# Patient Record
Sex: Female | Born: 1950 | Race: Black or African American | Hispanic: No | Marital: Married | Smoking: Never smoker
Health system: Southern US, Community
[De-identification: ages and names within clinical notes are randomized; demographics above are authoritative.]

## PROBLEM LIST (undated history)

## (undated) DIAGNOSIS — C801 Malignant (primary) neoplasm, unspecified: Secondary | ICD-10-CM

## (undated) HISTORY — PX: MM BREAST STEREO BIOPSY LEFT (ARMC HX): HXRAD1824

## (undated) HISTORY — PX: OTHER SURGICAL HISTORY: SHX169

## (undated) HISTORY — PX: COLONOSCOPY: SHX174

---

## 2001-08-20 ENCOUNTER — Other Ambulatory Visit: Admission: RE | Admit: 2001-08-20 | Discharge: 2001-08-20 | Payer: Self-pay | Admitting: Obstetrics and Gynecology

## 2001-10-29 ENCOUNTER — Ambulatory Visit (HOSPITAL_COMMUNITY): Admission: RE | Admit: 2001-10-29 | Discharge: 2001-10-29 | Payer: Self-pay | Admitting: General Surgery

## 2003-04-13 ENCOUNTER — Ambulatory Visit (HOSPITAL_COMMUNITY): Admission: RE | Admit: 2003-04-13 | Discharge: 2003-04-13 | Payer: Self-pay | Admitting: Physician Assistant

## 2003-04-13 ENCOUNTER — Encounter: Payer: Self-pay | Admitting: Pediatrics

## 2003-04-17 ENCOUNTER — Encounter (HOSPITAL_COMMUNITY): Admission: RE | Admit: 2003-04-17 | Discharge: 2003-05-17 | Payer: Self-pay | Admitting: Pediatrics

## 2005-06-28 ENCOUNTER — Ambulatory Visit (HOSPITAL_COMMUNITY): Admission: RE | Admit: 2005-06-28 | Discharge: 2005-06-28 | Payer: Self-pay | Admitting: Internal Medicine

## 2006-08-14 ENCOUNTER — Ambulatory Visit (HOSPITAL_COMMUNITY): Admission: RE | Admit: 2006-08-14 | Discharge: 2006-08-14 | Payer: Self-pay | Admitting: Obstetrics and Gynecology

## 2009-09-14 ENCOUNTER — Ambulatory Visit (HOSPITAL_COMMUNITY): Admission: RE | Admit: 2009-09-14 | Discharge: 2009-09-14 | Payer: Self-pay | Admitting: Pediatrics

## 2012-10-02 HISTORY — PX: MM BREAST STEREO BIOPSY LEFT (ARMC HX): HXRAD1824

## 2012-10-16 ENCOUNTER — Other Ambulatory Visit: Payer: Self-pay | Admitting: Adult Health

## 2012-10-16 DIAGNOSIS — Z139 Encounter for screening, unspecified: Secondary | ICD-10-CM

## 2012-10-18 ENCOUNTER — Ambulatory Visit (HOSPITAL_COMMUNITY)
Admission: RE | Admit: 2012-10-18 | Discharge: 2012-10-18 | Disposition: A | Discharge status: Home / Self Care | Payer: BC Managed Care – PPO | Source: Ambulatory Visit | Attending: Adult Health | Admitting: Adult Health

## 2012-10-18 DIAGNOSIS — Z1231 Encounter for screening mammogram for malignant neoplasm of breast: Secondary | ICD-10-CM | POA: Insufficient documentation

## 2012-10-18 DIAGNOSIS — Z139 Encounter for screening, unspecified: Secondary | ICD-10-CM

## 2012-10-23 ENCOUNTER — Other Ambulatory Visit: Payer: Self-pay | Admitting: Adult Health

## 2012-10-23 DIAGNOSIS — R928 Other abnormal and inconclusive findings on diagnostic imaging of breast: Secondary | ICD-10-CM

## 2012-11-13 ENCOUNTER — Ambulatory Visit (HOSPITAL_COMMUNITY)
Admission: RE | Admit: 2012-11-13 | Discharge: 2012-11-13 | Disposition: A | Discharge status: Home / Self Care | Payer: BC Managed Care – PPO | Source: Ambulatory Visit | Attending: Adult Health | Admitting: Adult Health

## 2012-11-13 ENCOUNTER — Other Ambulatory Visit: Payer: Self-pay | Admitting: Adult Health

## 2012-11-13 DIAGNOSIS — R921 Mammographic calcification found on diagnostic imaging of breast: Secondary | ICD-10-CM

## 2012-11-13 DIAGNOSIS — R928 Other abnormal and inconclusive findings on diagnostic imaging of breast: Secondary | ICD-10-CM | POA: Insufficient documentation

## 2012-11-25 ENCOUNTER — Other Ambulatory Visit (HOSPITAL_COMMUNITY): Payer: Self-pay | Admitting: Radiology

## 2012-11-25 ENCOUNTER — Ambulatory Visit
Admission: RE | Admit: 2012-11-25 | Discharge: 2012-11-25 | Disposition: A | Discharge status: Home / Self Care | Payer: BC Managed Care – PPO | Source: Ambulatory Visit | Attending: Adult Health | Admitting: Adult Health

## 2012-11-25 ENCOUNTER — Other Ambulatory Visit: Payer: Self-pay | Admitting: Adult Health

## 2012-11-25 DIAGNOSIS — R921 Mammographic calcification found on diagnostic imaging of breast: Secondary | ICD-10-CM

## 2012-12-12 ENCOUNTER — Other Ambulatory Visit (HOSPITAL_COMMUNITY)
Admission: RE | Admit: 2012-12-12 | Discharge: 2012-12-12 | Disposition: A | Discharge status: Home / Self Care | Payer: BC Managed Care – PPO | Source: Ambulatory Visit | Attending: Obstetrics and Gynecology | Admitting: Obstetrics and Gynecology

## 2012-12-12 ENCOUNTER — Other Ambulatory Visit: Payer: Self-pay | Admitting: Adult Health

## 2012-12-12 DIAGNOSIS — Z1151 Encounter for screening for human papillomavirus (HPV): Secondary | ICD-10-CM | POA: Insufficient documentation

## 2012-12-12 DIAGNOSIS — Z01419 Encounter for gynecological examination (general) (routine) without abnormal findings: Secondary | ICD-10-CM | POA: Insufficient documentation

## 2012-12-19 ENCOUNTER — Telehealth: Payer: Self-pay | Admitting: Adult Health

## 2012-12-20 NOTE — Telephone Encounter (Signed)
Confirmed WNL result of CBC, Lipid profile, CMP, and TSH.

## 2013-05-27 IMAGING — MG MM BX 1ST LESION STEREO
3 series · 3 of 3 positions shown · non-contrast
Comparison: Previous exams.

***ADDENDUM*** CREATED: 11/26/2012 [DATE]

Pathology revealed hyalinized and calcified fibroadenomatoid
nodules in the left breast. This was found to be concordant by Dr.
Hjhsiti E-Store. Pathology was relayed to the patient by telephone.
The patient reported doing well after the biopsy. Post biopsy
instructions were reviewed and her questions were answered. She was
encouraged to call The [REDACTED] for any
additional concerns. She was asked to return in 1 year for
screening mammography.
Pathology results are dictated by Arilenis Polonia RN, BSN on [facility]
***END ADDENDUM*** SIGNED BY: Marybel Healy, M.D.
CLINICAL DATA: Left breast calcifications.
STEREOTACTIC-GUIDED VACUUM ASSISTED BIOPSY OF THE LEFT BREAST AND
SPECIMEN RADIOGRAPH

[L CC (1 of 2)]
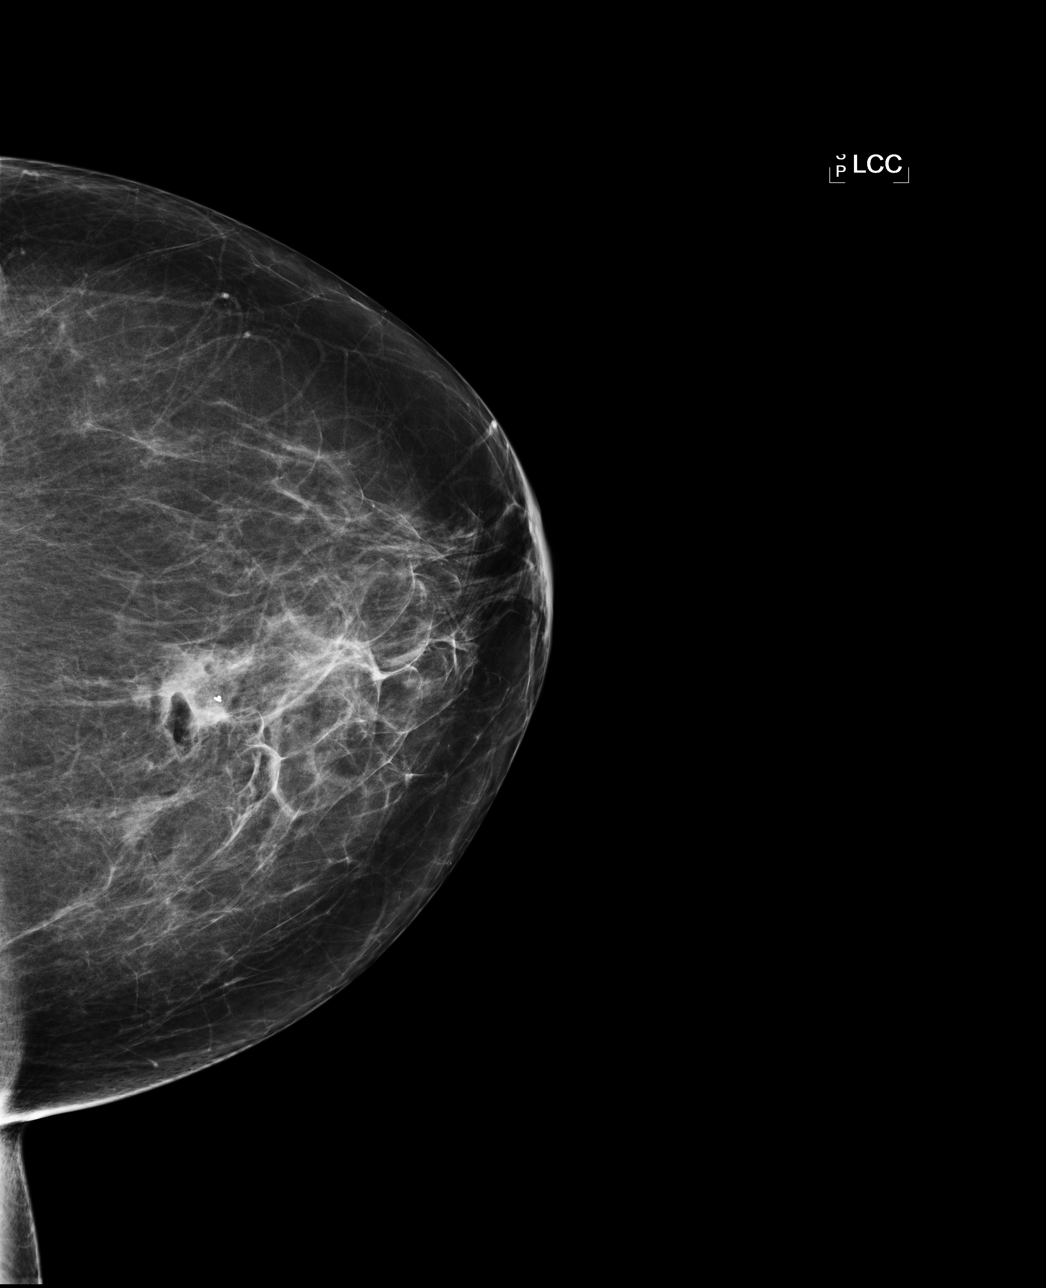

[L CC (2 of 2)]
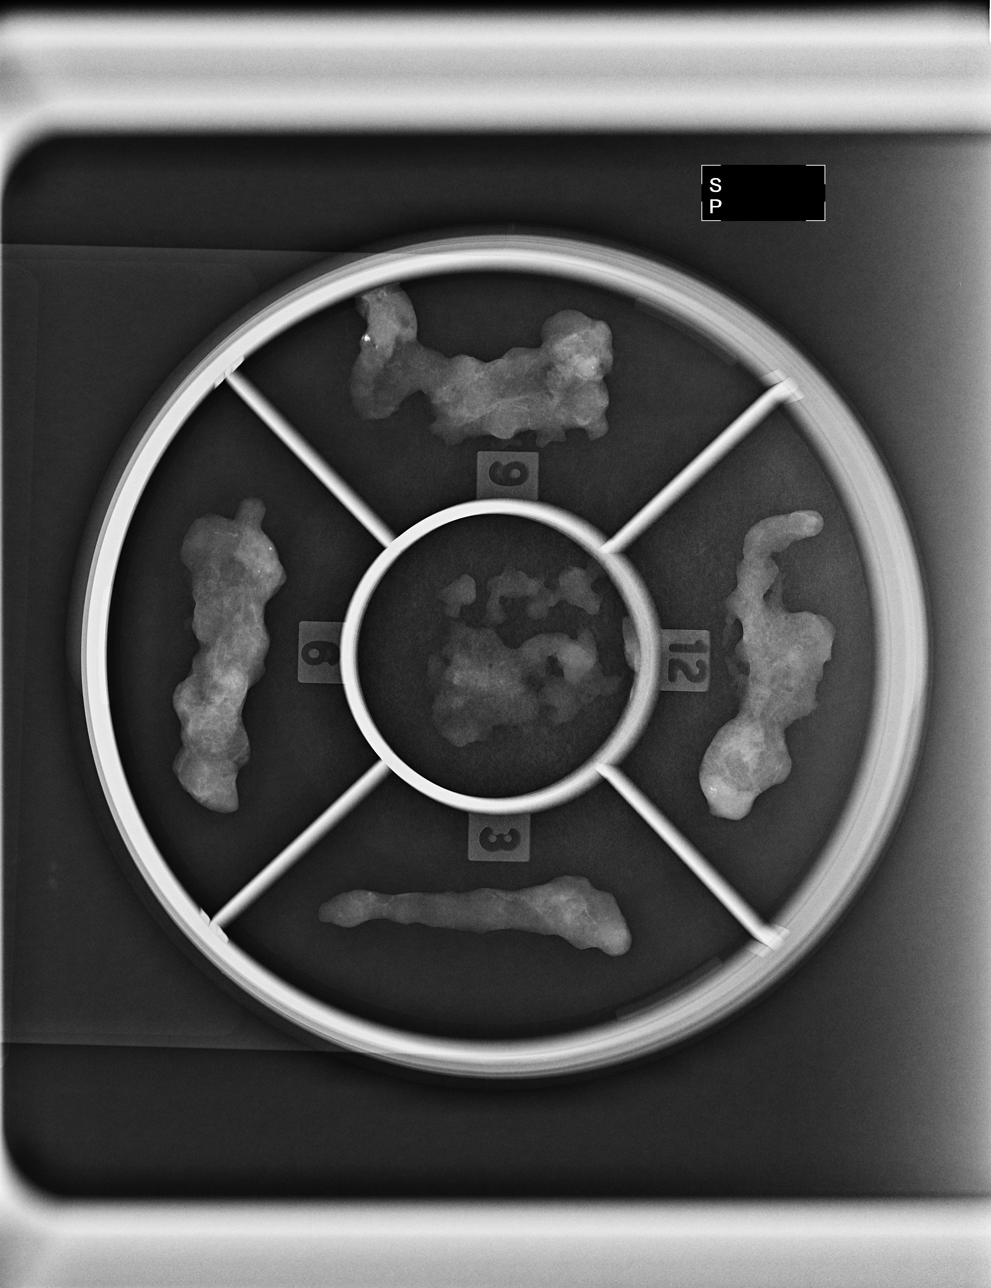

[L ML]
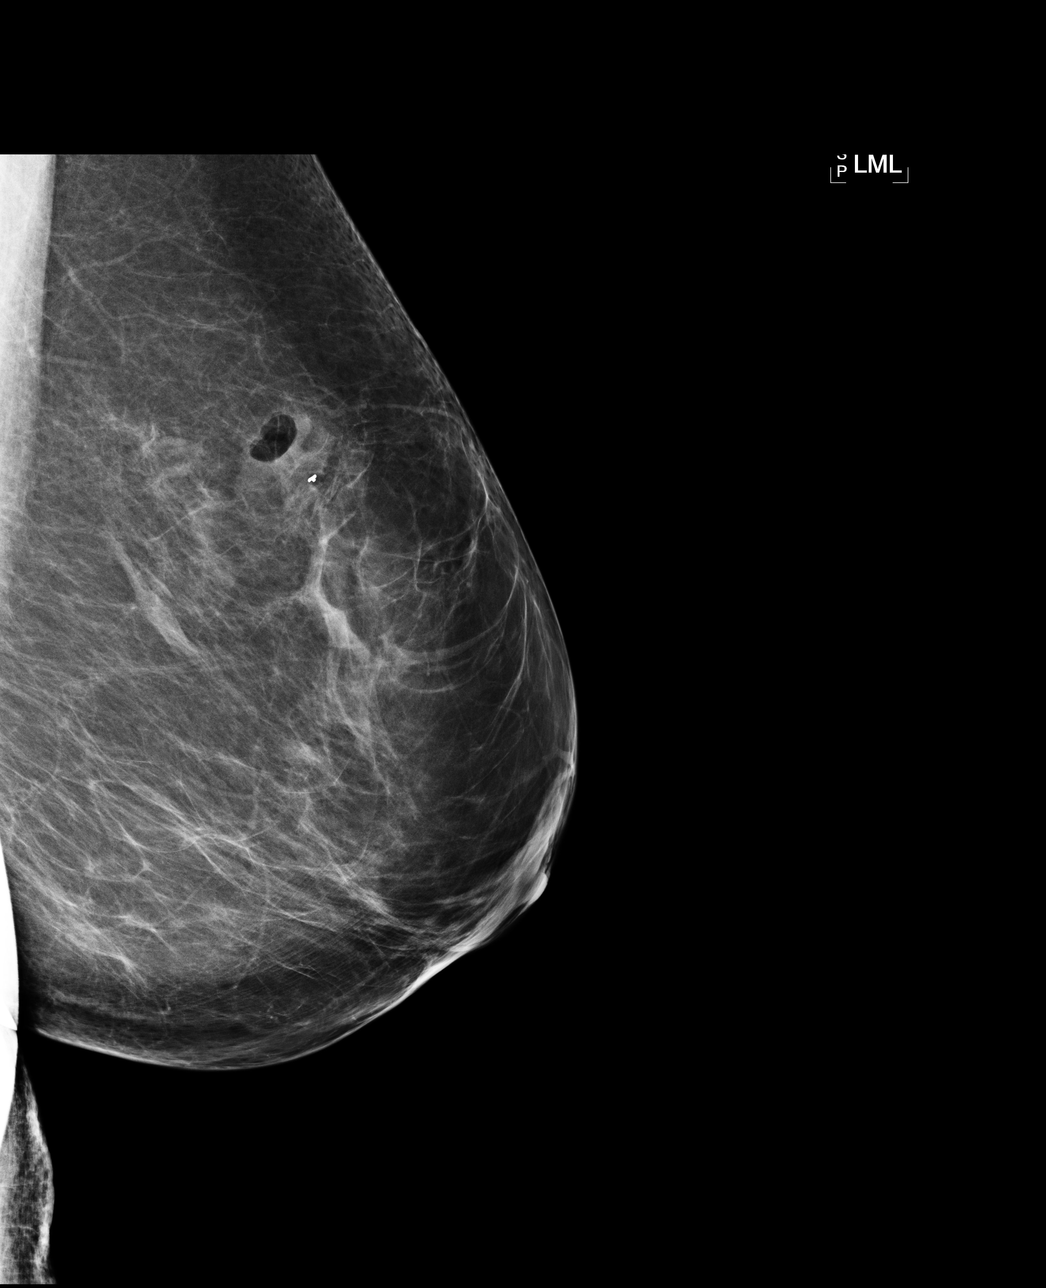

[3 of 3 positions shown; findings below may reference images not displayed]

I met with the patient and we discussed the procedure of
stereotactic-guided biopsy, including benefits and alternatives.
We discussed the high likelihood of a successful procedure. We
discussed the risks of the procedure, including infection,
bleeding, tissue injury, clip migration, and inadequate sampling.
Informed, written consent was given.

Using sterile technique, 2% lidocaine, stereotactic guidance, and a
9 gauge vacuum assisted device, biopsy was performed of the
calcifications located slightly medially and superiorly within the
left breast using a superior craniocaudal approach.  Specimen
radiograph was performed, showing multiple representative
calcifications within the specimen.  Specimens with calcifications
are identified for pathology.

At the conclusion of the procedure, a T-shaped tissue marker clip
was deployed into the biopsy cavity.  Follow-up 2-view mammogram
confirmed clip to be in appropriate position.
IMPRESSION: Stereotactic-guided biopsy of left breast calcifications as
discussed above.  No apparent complications.

## 2013-11-14 ENCOUNTER — Other Ambulatory Visit: Payer: Self-pay | Admitting: Adult Health

## 2013-11-14 DIAGNOSIS — Z1231 Encounter for screening mammogram for malignant neoplasm of breast: Secondary | ICD-10-CM

## 2013-11-28 ENCOUNTER — Ambulatory Visit (HOSPITAL_COMMUNITY): Payer: BC Managed Care – PPO

## 2014-01-05 ENCOUNTER — Other Ambulatory Visit (HOSPITAL_COMMUNITY): Payer: Self-pay | Admitting: Family Medicine

## 2014-01-05 DIAGNOSIS — C6930 Malignant neoplasm of unspecified choroid: Secondary | ICD-10-CM

## 2014-01-08 ENCOUNTER — Ambulatory Visit (HOSPITAL_COMMUNITY): Payer: BC Managed Care – PPO

## 2014-01-08 ENCOUNTER — Ambulatory Visit (HOSPITAL_COMMUNITY)
Admission: RE | Admit: 2014-01-08 | Discharge: 2014-01-08 | Disposition: A | Discharge status: Home / Self Care | Payer: BC Managed Care – PPO | Source: Ambulatory Visit | Attending: Family Medicine | Admitting: Family Medicine

## 2014-01-08 ENCOUNTER — Ambulatory Visit (HOSPITAL_COMMUNITY)
Admission: RE | Admit: 2014-01-08 | Discharge: 2014-01-08 | Disposition: A | Discharge status: Home / Self Care | Payer: BC Managed Care – PPO | Source: Ambulatory Visit | Attending: Adult Health | Admitting: Adult Health

## 2014-01-08 DIAGNOSIS — C6930 Malignant neoplasm of unspecified choroid: Secondary | ICD-10-CM | POA: Insufficient documentation

## 2014-01-08 DIAGNOSIS — Z1231 Encounter for screening mammogram for malignant neoplasm of breast: Secondary | ICD-10-CM | POA: Insufficient documentation

## 2014-01-08 MED ORDER — IOHEXOL 300 MG/ML  SOLN
100.0000 mL | Freq: Once | INTRAMUSCULAR | Status: AC | PRN
  Administered 2014-01-08: 100 mL via INTRAVENOUS

## 2014-01-08 MED ORDER — SODIUM CHLORIDE 0.9 % IJ SOLN
INTRAMUSCULAR | Status: AC
  Filled 2014-01-08: qty 500

## 2014-11-24 DIAGNOSIS — Z0289 Encounter for other administrative examinations: Secondary | ICD-10-CM

## 2014-12-02 ENCOUNTER — Other Ambulatory Visit: Payer: Self-pay | Admitting: Family Medicine

## 2014-12-02 DIAGNOSIS — R51 Headache: Principal | ICD-10-CM

## 2014-12-02 DIAGNOSIS — C6931 Malignant neoplasm of right choroid: Secondary | ICD-10-CM

## 2014-12-02 DIAGNOSIS — G8929 Other chronic pain: Secondary | ICD-10-CM

## 2014-12-15 ENCOUNTER — Ambulatory Visit
Admission: RE | Admit: 2014-12-15 | Discharge: 2014-12-15 | Disposition: A | Discharge status: Home / Self Care | Payer: BLUE CROSS/BLUE SHIELD | Source: Ambulatory Visit | Attending: Family Medicine | Admitting: Family Medicine

## 2014-12-15 DIAGNOSIS — C6931 Malignant neoplasm of right choroid: Secondary | ICD-10-CM

## 2014-12-15 DIAGNOSIS — R51 Headache: Principal | ICD-10-CM

## 2014-12-15 DIAGNOSIS — G8929 Other chronic pain: Secondary | ICD-10-CM

## 2014-12-15 MED ORDER — GADOBENATE DIMEGLUMINE 529 MG/ML IV SOLN
15.0000 mL | Freq: Once | INTRAVENOUS | Status: AC | PRN
  Administered 2014-12-15: 15 mL via INTRAVENOUS

## 2014-12-15 NOTE — Progress Notes (Addendum)
Pt to nursing area from MRI due to 2 hives on her face. No other signs of allergic reaction.  Dr. Jola Baptist in to visit and spoke with pt. Pt discharged.

## 2015-02-23 ENCOUNTER — Other Ambulatory Visit (HOSPITAL_COMMUNITY): Payer: Self-pay | Admitting: Family Medicine

## 2015-02-23 DIAGNOSIS — Z1231 Encounter for screening mammogram for malignant neoplasm of breast: Secondary | ICD-10-CM

## 2015-03-03 ENCOUNTER — Other Ambulatory Visit (HOSPITAL_COMMUNITY): Payer: Self-pay | Admitting: Family Medicine

## 2015-03-03 ENCOUNTER — Ambulatory Visit (HOSPITAL_COMMUNITY)
Admission: RE | Admit: 2015-03-03 | Discharge: 2015-03-03 | Disposition: A | Discharge status: Home / Self Care | Payer: BLUE CROSS/BLUE SHIELD | Source: Ambulatory Visit | Attending: Family Medicine | Admitting: Family Medicine

## 2015-03-03 DIAGNOSIS — Z1231 Encounter for screening mammogram for malignant neoplasm of breast: Secondary | ICD-10-CM

## 2016-01-25 DIAGNOSIS — C6931 Malignant neoplasm of right choroid: Secondary | ICD-10-CM | POA: Diagnosis not present

## 2016-04-20 ENCOUNTER — Other Ambulatory Visit (HOSPITAL_COMMUNITY): Payer: Self-pay | Admitting: Family Medicine

## 2016-04-20 DIAGNOSIS — Z1231 Encounter for screening mammogram for malignant neoplasm of breast: Secondary | ICD-10-CM

## 2016-04-27 ENCOUNTER — Ambulatory Visit (HOSPITAL_COMMUNITY)
Admission: RE | Admit: 2016-04-27 | Discharge: 2016-04-27 | Disposition: A | Discharge status: Home / Self Care | Payer: Medicare HMO | Source: Ambulatory Visit | Attending: Family Medicine | Admitting: Family Medicine

## 2016-04-27 DIAGNOSIS — Z1231 Encounter for screening mammogram for malignant neoplasm of breast: Secondary | ICD-10-CM

## 2016-05-02 ENCOUNTER — Other Ambulatory Visit (HOSPITAL_COMMUNITY): Payer: Self-pay | Admitting: Family Medicine

## 2016-05-02 ENCOUNTER — Telehealth: Payer: Self-pay

## 2016-05-02 DIAGNOSIS — C6931 Malignant neoplasm of right choroid: Secondary | ICD-10-CM | POA: Diagnosis not present

## 2016-05-02 NOTE — Telephone Encounter (Signed)
Pt called to be triaged. I told her that DS would be in touch with her and she does her triages between 130-430pm and we would need to get the referral that was sent in April.  She asked for DS to call her back tomorrow afternoon and she would have all her stuff together when she calls. [id number]

## 2016-05-02 NOTE — Telephone Encounter (Signed)
Dr Cathey Endow office called to verify if the patient has had her colonoscopy. They had sent [place] a referral back in April. I told the girl that you had mailed the patient a letter in April, but never got a reply back. She is going to call the patient and have her call us.

## 2016-05-03 ENCOUNTER — Telehealth: Payer: Self-pay

## 2016-05-03 DIAGNOSIS — Z4420 Encounter for fitting and adjustment of artificial eye, unspecified: Secondary | ICD-10-CM | POA: Diagnosis not present

## 2016-05-03 NOTE — Telephone Encounter (Signed)
Triaged today.  

## 2016-05-03 NOTE — Telephone Encounter (Signed)
Noted  

## 2016-05-03 NOTE — Telephone Encounter (Signed)
Pt has called again today to speak with DS about being triaged. I told her that DS was out of her office at the moment and I would let her know that she had called. [id number]

## 2016-05-08 NOTE — Telephone Encounter (Signed)
Gastroenterology Pre-Procedure Review  Request Date: 05/03/2016 Requesting Physician: Iona Beard  PATIENT REVIEW QUESTIONS: The patient responded to the following health history questions as indicated:    1. Diabetes Melitis: no 2. Joint replacements in the past 12 months: no 3. Major health problems in the past 3 months: no 4. Has an artificial valve or MVP: no 5. Has a defibrillator: no 6. Has been advised in past to take antibiotics in advance of a procedure like teeth cleaning: no 7. Family history of colon cancer: no  8. Alcohol Use: no 9. History of sleep apnea: no     MEDICATIONS & ALLERGIES:    Patient reports the following regarding taking any blood thinners:   Plavix? no Aspirin? YES Coumadin? no  Patient confirms/reports the following medications:  Current Outpatient Prescriptions  Medication Sig Dispense Refill  . aspirin 325 MG tablet Take 325 mg by mouth daily.     No current facility-administered medications for this visit.     Patient confirms/reports the following allergies:  Allergies  Allergen Reactions  . Gadolinium Derivatives Hives, Nausea Only and Swelling    Pt had nausea, swelling of the upper lip and facial hives after the injection.   . Fluorescein Nausea And Vomiting  . Iodinated Diagnostic Agents Nausea And Vomiting    No orders of the defined types were placed in this encounter.   AUTHORIZATION INFORMATION Primary Insurance:   ID #:  Group #:  Pre-Cert / Auth required: Pre-Cert / Auth #:   Secondary Insurance:  ID #:   Group #:  Pre-Cert / Auth required:  Pre-Cert / Auth #:   SCHEDULE INFORMATION: Procedure has been scheduled as follows:  Date:                 Time:   Location:   This Gastroenterology Pre-Precedure Review Form is being routed to the following provider(s): Barney Drain, MD

## 2016-05-09 ENCOUNTER — Ambulatory Visit (HOSPITAL_COMMUNITY)
Admission: RE | Admit: 2016-05-09 | Discharge: 2016-05-09 | Disposition: A | Discharge status: Home / Self Care | Payer: Medicare HMO | Source: Ambulatory Visit | Attending: Family Medicine | Admitting: Family Medicine

## 2016-05-09 DIAGNOSIS — C6931 Malignant neoplasm of right choroid: Secondary | ICD-10-CM

## 2016-05-09 NOTE — Telephone Encounter (Signed)
PREPOPIK-DRINK WATER TO KEEP URINE LIGHT YELLOW.  FULL LIQUIDS WITH BREAKFAST.  Full Liquid Diet A high-calorie, high-protein supplement should be used to meet your nutritional requirements when the full liquid diet is continued for more than 2 or 3 days. If this diet is to be used for an extended period of time (more than 7 days), a multivitamin should be considered.  Breads and Starches  Allowed: None are allowed  Avoid: Any others.    Potatoes/Pasta/Rice  Allowed: ANY ITEM AS A SOUP OR SMALL PLATE OF MASHED POTATOES OR SCRAMBLED EGGS.       Vegetables  Allowed: Strained tomato or vegetable juice. Vegetables pureed in soup.   Avoid: Any others.    Fruit  Allowed: Any strained fruit juices and fruit drinks. Include 1 serving of citrus or vitamin C-enriched fruit juice daily.   Avoid: Any others.  Meat and Meat Substitutes  Allowed: Egg  Avoid: Any meat, fish, or fowl. All cheese.  Milk  Allowed: SOY Milk beverages, including milk shakes and instant breakfast mixes. Smooth yogurt.   Avoid: Any others. Avoid dairy products if not tolerated.    Soups and Combination Foods  Allowed: Broth, strained cream soups. Strained, broth-based soups.   Avoid: Any others.    Desserts and Sweets  Allowed: flavored gelatin, tapioca, ice cream, sherbet, smooth pudding, junket, fruit ices, frozen ice pops, pudding pops, frozen fudge pops, chocolate syrup. Sugar, honey, jelly, syrup.   Avoid: Any others.  Fats and Oils  Allowed: Margarine, butter, cream, sour cream, oils.   Avoid: Any others.  Beverages  Allowed: All.   Avoid: None.  Condiments  Allowed: Iodized salt, pepper, spices, flavorings. Cocoa powder.   Avoid: Any others.    SAMPLE MEAL PLAN Breakfast   cup orange juice.   1 OR 2 EGGS  1 cup milk.   1 cup beverage (coffee or tea).   Cream or sugar, if desired.    Midmorning Snack  2 SCRAMBLED OR HARD BOILED EGG   Lunch  1 cup cream  soup.    cup fruit juice.   1 cup milk.    cup custard.   1 cup beverage (coffee or tea).   Cream or sugar, if desired.    Midafternoon Snack  1 cup milk shake.  Dinner  1 cup cream soup.    cup fruit juice.   1 cup MILK    cup pudding.   1 cup beverage (coffee or tea).   Cream or sugar, if desired.  Evening Snack  1 cup supplement.  To increase calories, add sugar, cream, butter, or margarine if possible. Nutritional supplements will also increase the total calories.

## 2016-05-17 NOTE — Telephone Encounter (Signed)
LMOM to call for appt date and time.  

## 2016-05-22 NOTE — Telephone Encounter (Signed)
Letter mailed to pt to call for appt date and time.  

## 2016-06-02 ENCOUNTER — Inpatient Hospital Stay (HOSPITAL_COMMUNITY): Admission: RE | Admit: 2016-06-02 | Payer: BLUE CROSS/BLUE SHIELD | Source: Ambulatory Visit

## 2016-06-13 ENCOUNTER — Ambulatory Visit (HOSPITAL_COMMUNITY)
Admission: RE | Admit: 2016-06-13 | Discharge: 2016-06-13 | Disposition: A | Discharge status: Home / Self Care | Payer: Medicare HMO | Source: Ambulatory Visit | Attending: Family Medicine | Admitting: Family Medicine

## 2016-06-13 DIAGNOSIS — K7689 Other specified diseases of liver: Secondary | ICD-10-CM | POA: Diagnosis not present

## 2016-06-13 DIAGNOSIS — I700 Atherosclerosis of aorta: Secondary | ICD-10-CM | POA: Insufficient documentation

## 2016-06-13 DIAGNOSIS — C6931 Malignant neoplasm of right choroid: Secondary | ICD-10-CM | POA: Diagnosis not present

## 2016-06-13 DIAGNOSIS — R918 Other nonspecific abnormal finding of lung field: Secondary | ICD-10-CM | POA: Diagnosis not present

## 2016-06-13 DIAGNOSIS — C6930 Malignant neoplasm of unspecified choroid: Secondary | ICD-10-CM | POA: Diagnosis not present

## 2016-06-13 DIAGNOSIS — K469 Unspecified abdominal hernia without obstruction or gangrene: Secondary | ICD-10-CM | POA: Diagnosis not present

## 2016-06-13 MED ORDER — IIOPAMIDOL (ISOVUE-250) INJECTION 51%: 100.0000 mL | Freq: Once | INTRAVENOUS | Status: DC | PRN

## 2016-06-13 MED ORDER — IOPAMIDOL (ISOVUE-300) INJECTION 61%
100.0000 mL | Freq: Once | INTRAVENOUS | Status: AC | PRN
  Administered 2016-06-13: 100 mL via INTRAVENOUS

## 2016-06-14 ENCOUNTER — Telehealth: Payer: Self-pay

## 2016-06-14 NOTE — Telephone Encounter (Signed)
Pt called to schedule tcs [phone]

## 2016-06-16 NOTE — Telephone Encounter (Signed)
Pt would like to have her TCS on 06/27/16 @ 1:00.

## 2016-06-19 ENCOUNTER — Other Ambulatory Visit: Payer: Self-pay

## 2016-06-19 DIAGNOSIS — Z1211 Encounter for screening for malignant neoplasm of colon: Secondary | ICD-10-CM

## 2016-06-19 MED ORDER — SOD PICOSULFATE-MAG OX-CIT ACD 10-3.5-12 MG-GM-GM PO PACK: 1.0000 | PACK | ORAL | 0 refills | Status: DC

## 2016-06-19 NOTE — Telephone Encounter (Signed)
I updated the triage. Pt not taking any meds. Sending the [place] as originally ordered by Dr. Oneida Alar.  Rx to pharmacy and instructions mailed to pt.

## 2016-06-19 NOTE — Addendum Note (Signed)
Addended by: Everardo All on: 06/19/2016 12:46 PM   Modules accepted: Orders

## 2016-06-19 NOTE — Telephone Encounter (Signed)
REVIEWED-NO ADDITIONAL RECOMMENDATIONS. 

## 2016-06-20 NOTE — Telephone Encounter (Signed)
Per Hoyle Sauer, I had to move this pt down. She is now on for 1:45 pm and will check in at 12:45 pm. Pt is aware.

## 2016-06-26 ENCOUNTER — Telehealth: Payer: Self-pay

## 2016-06-26 NOTE — Telephone Encounter (Signed)
I called [facility] and spoke to [place] D who said a PA is not required for the screening colonoscopy. Reference # [id number].

## 2016-06-27 ENCOUNTER — Encounter (HOSPITAL_COMMUNITY): Payer: Self-pay | Admitting: *Deleted

## 2016-06-27 ENCOUNTER — Ambulatory Visit (HOSPITAL_COMMUNITY)
Admission: RE | Admit: 2016-06-27 | Discharge: 2016-06-27 | Disposition: A | Discharge status: Home / Self Care | Payer: Medicare HMO | Source: Ambulatory Visit | Attending: Gastroenterology | Admitting: Gastroenterology

## 2016-06-27 ENCOUNTER — Encounter (HOSPITAL_COMMUNITY)
Admission: RE | Disposition: A | Discharge status: Home / Self Care | Payer: Self-pay | Source: Ambulatory Visit | Attending: Gastroenterology

## 2016-06-27 DIAGNOSIS — Z1211 Encounter for screening for malignant neoplasm of colon: Secondary | ICD-10-CM

## 2016-06-27 DIAGNOSIS — Q438 Other specified congenital malformations of intestine: Secondary | ICD-10-CM | POA: Insufficient documentation

## 2016-06-27 DIAGNOSIS — K648 Other hemorrhoids: Secondary | ICD-10-CM | POA: Insufficient documentation

## 2016-06-27 DIAGNOSIS — Z9001 Acquired absence of eye: Secondary | ICD-10-CM | POA: Insufficient documentation

## 2016-06-27 DIAGNOSIS — D124 Benign neoplasm of descending colon: Secondary | ICD-10-CM | POA: Diagnosis not present

## 2016-06-27 DIAGNOSIS — K644 Residual hemorrhoidal skin tags: Secondary | ICD-10-CM | POA: Diagnosis not present

## 2016-06-27 DIAGNOSIS — Z7982 Long term (current) use of aspirin: Secondary | ICD-10-CM | POA: Diagnosis not present

## 2016-06-27 HISTORY — DX: Malignant (primary) neoplasm, unspecified: C80.1

## 2016-06-27 HISTORY — PX: COLONOSCOPY: SHX5424

## 2016-06-27 SURGERY — COLONOSCOPY
Anesthesia: Moderate Sedation

## 2016-06-27 MED ORDER — SODIUM CHLORIDE 0.9 % IV SOLN
INTRAVENOUS | Status: DC
  Administered 2016-06-27: 10:00:00 via INTRAVENOUS

## 2016-06-27 MED ORDER — MIDAZOLAM HCL 5 MG/5ML IJ SOLN
INTRAMUSCULAR | Status: AC
  Filled 2016-06-27: qty 10

## 2016-06-27 MED ORDER — MIDAZOLAM HCL 5 MG/5ML IJ SOLN
INTRAMUSCULAR | Status: DC | PRN
  Administered 2016-06-27: 1 mg via INTRAVENOUS
  Administered 2016-06-27 (×2): 2 mg via INTRAVENOUS

## 2016-06-27 MED ORDER — MEPERIDINE HCL 100 MG/ML IJ SOLN
INTRAMUSCULAR | Status: DC | PRN
  Administered 2016-06-27 (×2): 25 mg via INTRAVENOUS

## 2016-06-27 MED ORDER — STERILE WATER FOR IRRIGATION IR SOLN
Status: DC | PRN
  Administered 2016-06-27: 2.5 mL

## 2016-06-27 MED ORDER — MEPERIDINE HCL 100 MG/ML IJ SOLN
INTRAMUSCULAR | Status: AC
  Filled 2016-06-27: qty 2

## 2016-06-27 NOTE — Op Note (Signed)
[facility] Patient Name: Tara French Procedure Date: 06/27/2016 11:06 AM MRN: [id number] Date of Birth: 1951/04/10 Attending MD: Barney Drain , MD CSN: [id number] Age: 65 Admit Type: Outpatient Procedure:                Colonoscopy WITH COLD FORCEPS POLYPECTOMY Indications:              Screening for colorectal malignant neoplasm Providers:                Barney Drain, MD, Lurline Del, RN, Randa Spike,                            Technician Referring MD:             Barrie Folk. Hill MD, MD Medicines:                Meperidine 50 mg IV, Midazolam 5 mg IV Complications:            No immediate complications. Estimated blood loss:                            None. Estimated Blood Loss:     Estimated blood loss was minimal. Procedure:                Pre-Anesthesia Assessment:                           - Prior to the procedure, a History and Physical                            was performed, and patient medications and                            allergies were reviewed. The patient's tolerance of                            previous anesthesia was also reviewed. The risks                            and benefits of the procedure and the sedation                            options and risks were discussed with the patient.                            All questions were answered, and informed consent                            was obtained. Prior Anticoagulants: The patient has                            taken aspirin, last dose was 4 weeks prior to                            procedure. ASA Grade Assessment: I - A normal,  healthy patient. After reviewing the risks and                            benefits, the patient was deemed in satisfactory                            condition to undergo the procedure. After obtaining                            informed consent, the colonoscope was passed under                            direct vision. Throughout the procedure,  the                            patient's blood pressure, pulse, and oxygen                            saturations were monitored continuously. The                            [id number] scope was introduced through                            the anus and advanced to the the cecum, identified                            by appendiceal orifice and ileocecal valve. The                            ileocecal valve, appendiceal orifice, and rectum                            were photographed. The colonoscopy was somewhat                            difficult due to a tortuous colon. Successful                            completion of the procedure was aided by COLOWRAP.                            The patient tolerated the procedure fairly well.                            The quality of the bowel preparation was good. Scope In: 11:33:13 AM Scope Out: 11:51:33 AM Scope Withdrawal Time: 0 hours 13 minutes 58 seconds  Total Procedure Duration: 0 hours 18 minutes 20 seconds  Findings:      The digital rectal exam findings include non-thrombosed external       hemorrhoids.      A 3 mm polyp was found in the descending colon. The polyp was sessile.       The polyp was removed with a cold biopsy forceps. Resection and  retrieval were complete.      The recto-sigmoid colon and sigmoid colon were moderately redundant. AND       COLOWRAP      Non-bleeding external and internal hemorrhoids were found. The       hemorrhoids were moderate. Impression:               - Non-thrombosed external hemorrhoids found on                            digital rectal exam.                           - One 3 mm polyp in the descending colon, removed                            with a cold biopsy forceps.                           - Redundant LEFT colon.                           - Non-bleeding external and internal hemorrhoids. Moderate Sedation:      Moderate (conscious) sedation was administered by the  endoscopy nurse       and supervised by the endoscopist. The following parameters were       monitored: oxygen saturation, heart rate, blood pressure, and response       to care. Total physician intraservice time was 30 minutes. Recommendation:           - High fiber diet.                           - Continue present medications.                           - Await pathology results.                           - Repeat colonoscopy in 5-10 years for surveillance.                           - Patient has a contact number available for                            emergencies. The signs and symptoms of potential                            delayed complications were discussed with the                            patient. Return to normal activities tomorrow.                            Written discharge instructions were provided to the                            patient. Procedure Code(s):        ---  Professional ---                           [phone], Colonoscopy, flexible; with biopsy, single                            or multiple                           99152, Moderate sedation services provided by the                            same physician or other qualified health care                            professional performing the diagnostic or                            therapeutic service that the sedation supports,                            requiring the presence of an independent trained                            observer to assist in the monitoring of the                            patient's level of consciousness and physiological                            status; initial 15 minutes of intraservice time,                            patient age 43 years or older                           [phone], Moderate sedation services; each additional                            15 minutes intraservice time Diagnosis Code(s):        --- Professional ---                           Z12.11, Encounter for  screening for malignant                            neoplasm of colon                           D12.4, Benign neoplasm of descending colon                           K64.4, Residual hemorrhoidal skin tags                           K64.8, Other hemorrhoids  Q43.8, Other specified congenital malformations of                            intestine CPT copyright 2016 [employer]. All rights reserved. The codes documented in this report are preliminary and upon coder review may  be revised to meet current compliance requirements. Barney Drain, MD Barney Drain, MD 06/27/2016 12:07:49 PM This report has been signed electronically. Number of Addenda: 0

## 2016-06-27 NOTE — Discharge Instructions (Signed)
You had 1 polyp removed. You have internal hemorrhoids.   DRINK WATER TO KEEP YOUR URINE LIGHT YELLOW.  FOLLOW A HIGH FIBER DIET. AVOID ITEMS THAT CAUSE BLOATING & GAS. SEE INFO BELOW.  YOUR BIOPSY RESULTS WILL BE AVAILABLE IN MY CHART SEP 29 AND MY OFFICE WILL CONTACT YOU IN 10-14 DAYS WITH YOUR RESULTS.   Next colonoscopy in 5-10 years.    Colonoscopy Care After Read the instructions outlined below and refer to this sheet in the next week. These discharge instructions provide you with general information on caring for yourself after you leave the hospital. While your treatment has been planned according to the most current medical practices available, unavoidable complications occasionally occur. If you have any problems or questions after discharge, call DR. FIELDS, [phone].  ACTIVITY  You may resume your regular activity, but move at a slower pace for the next 24 hours.   Take frequent rest periods for the next 24 hours.   Walking will help get rid of the air and reduce the bloated feeling in your belly (abdomen).   No driving for 24 hours (because of the medicine (anesthesia) used during the test).   You may shower.   Do not sign any important legal documents or operate any machinery for 24 hours (because of the anesthesia used during the test).    NUTRITION  Drink plenty of fluids.   You may resume your normal diet as instructed by your doctor.   Begin with a light meal and progress to your normal diet. Heavy or fried foods are harder to digest and may make you feel sick to your stomach (nauseated).   Avoid alcoholic beverages for 24 hours or as instructed.    MEDICATIONS  You may resume your normal medications.   WHAT YOU CAN EXPECT TODAY  Some feelings of bloating in the abdomen.   Passage of more gas than usual.   Spotting of blood in your stool or on the toilet paper  .  IF YOU HAD POLYPS REMOVED DURING THE COLONOSCOPY:  Eat a soft diet IF YOU  HAVE NAUSEA, BLOATING, ABDOMINAL PAIN, OR VOMITING.    FINDING OUT THE RESULTS OF YOUR TEST Not all test results are available during your visit. DR. Oneida Alar WILL CALL YOU WITHIN 14 DAYS OF YOUR PROCEDUE WITH YOUR RESULTS. Do not assume everything is normal if you have not heard from DR. FIELDS, CALL HER OFFICE AT [phone].  SEEK IMMEDIATE MEDICAL ATTENTION AND CALL THE OFFICE: [phone] IF:  You have more than a spotting of blood in your stool.   Your belly is swollen (abdominal distention).   You are nauseated or vomiting.   You have a temperature over 101F.   You have abdominal pain or discomfort that is severe or gets worse throughout the day.   High-Fiber Diet A high-fiber diet changes your normal diet to include more whole grains, legumes, fruits, and vegetables. Changes in the diet involve replacing refined carbohydrates with unrefined foods. The calorie level of the diet is essentially unchanged. The Dietary Reference Intake (recommended amount) for adult males is 38 grams per day. For adult females, it is 25 grams per day. Pregnant and lactating women should consume 28 grams of fiber per day. Fiber is the intact part of a plant that is not broken down during digestion. Functional fiber is fiber that has been isolated from the plant to provide a beneficial effect in the body. PURPOSE  Increase stool bulk.   Ease and regulate  bowel movements.   Lower cholesterol.   REDUCE RISK OF COLON CANCER  INDICATIONS THAT YOU NEED MORE FIBER  Constipation and hemorrhoids.   Uncomplicated diverticulosis (intestine condition) and irritable bowel syndrome.   Weight management.   As a protective measure against hardening of the arteries (atherosclerosis), diabetes, and cancer.   GUIDELINES FOR INCREASING FIBER IN THE DIET  Start adding fiber to the diet slowly. A gradual increase of about 5 more grams (2 slices of whole-wheat bread, 2 servings of most fruits or vegetables, or  1 bowl of high-fiber cereal) per day is best. Too rapid an increase in fiber may result in constipation, flatulence, and bloating.   Drink enough water and fluids to keep your urine clear or pale yellow. Water, juice, or caffeine-free drinks are recommended. Not drinking enough fluid may cause constipation.   Eat a variety of high-fiber foods rather than one type of fiber.   Try to increase your intake of fiber through using high-fiber foods rather than fiber pills or supplements that contain small amounts of fiber.   The goal is to change the types of food eaten. Do not supplement your present diet with high-fiber foods, but replace foods in your present diet.   INCLUDE A VARIETY OF FIBER SOURCES  Replace refined and processed grains with whole grains, canned fruits with fresh fruits, and incorporate other fiber sources. White rice, white breads, and most bakery goods contain little or no fiber.   Brown whole-grain rice, buckwheat oats, and many fruits and vegetables are all good sources of fiber. These include: broccoli, Brussels sprouts, cabbage, cauliflower, beets, sweet potatoes, white potatoes (skin on), carrots, tomatoes, eggplant, squash, berries, fresh fruits, and dried fruits.   Cereals appear to be the richest source of fiber. Cereal fiber is found in whole grains and bran. Bran is the fiber-rich outer coat of cereal grain, which is largely removed in refining. In whole-grain cereals, the bran remains. In breakfast cereals, the largest amount of fiber is found in those with "bran" in their names. The fiber content is sometimes indicated on the label.   You may need to include additional fruits and vegetables each day.   In baking, for 1 cup white flour, you may use the following substitutions:   1 cup whole-wheat flour minus 2 tablespoons.   1/2 cup white flour plus 1/2 cup whole-wheat flour.   Polyps, Colon  A polyp is extra tissue that grows inside your body. Colon polyps grow  in the large intestine. The large intestine, also called the colon, is part of your digestive system. It is a long, hollow tube at the end of your digestive tract where your body makes and stores stool. Most polyps are not dangerous. They are benign. This means they are not cancerous. But over time, some types of polyps can turn into cancer. Polyps that are smaller than a pea are usually not harmful. But larger polyps could someday become or may already be cancerous. To be safe, doctors remove all polyps and test them.   PREVENTION There is not one sure way to prevent polyps. You might be able to lower your risk of getting them if you:  Eat more fruits and vegetables and less fatty food.   Do not smoke.   Avoid alcohol.   Exercise every day.   Lose weight if you are overweight.   Eating more calcium and folate can also lower your risk of getting polyps. Some foods that are rich in calcium are  milk, cheese, and broccoli. Some foods that are rich in folate are chickpeas, kidney beans, and spinach.   Hemorrhoids Hemorrhoids are dilated (enlarged) veins around the rectum. Sometimes clots will form in the veins. This makes them swollen and painful. These are called thrombosed hemorrhoids. Causes of hemorrhoids include:  Constipation.   Straining to have a bowel movement.   HEAVY LIFTING  HOME CARE INSTRUCTIONS  Eat a well balanced diet and drink 6 to 8 glasses of water every day to avoid constipation. You may also use a bulk laxative.   Avoid straining to have bowel movements.   Keep anal area dry and clean.   Do not use a donut shaped pillow or sit on the toilet for long periods. This increases blood pooling and pain.   Move your bowels when your body has the urge; this will require less straining and will decrease pain and pressure.

## 2016-06-27 NOTE — H&P (Addendum)
  Primary Care Physician:  Maggie Font, MD Primary Gastroenterologist:  Dr. Oneida Alar  Pre-Procedure History & Physical: HPI:  Tara French is a 65 y.o. female here for [facility].  Past Medical History:  Diagnosis Date  . 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 Cold knife conization    . COLONOSCOPY    . MM BREAST STEREO BIOPSY LEFT ([facility])    . Right eye removal      Prior to Admission medications   Medication Sig Start Date End Date Taking? Authorizing Provider  aspirin 325 MG tablet Take 325 mg by mouth daily.   Yes Historical Provider, MD  Sod Picosulfate-Mag Ox-Cit Acd 10-3.5-12 MG-GM-GM PACK Take 1 Container by mouth as directed. 06/19/16  Yes Danie Binder, MD    Allergies as of 06/19/2016 - Review Complete 05/03/2016  Allergen Reaction Noted  . Gadolinium derivatives Hives, Nausea Only, and Swelling 12/15/2014  . Fluorescein Nausea And Vomiting 01/08/2014  . Iodinated diagnostic agents Hives and Nausea And Vomiting 12/15/2014    History reviewed. No pertinent family history.  Social History   Social History  . Marital status: Married    Spouse name: N/A  . Number of children: N/A  . Years of education: N/A   Occupational History  . Not on file.   Social History Main Topics  . Smoking status: Never Smoker  . Smokeless tobacco: Never Used  . Alcohol use No  . Drug use: No  . Sexual activity: Not on file   Other Topics Concern  . Not on file   Social History Narrative  . No narrative on file    Review of Systems: See HPI, otherwise negative ROS   Physical Exam: BP (!) 123/97   Pulse 67   Temp 98 F (36.7 C) (Oral)   Resp 19   Ht 5' (1.524 m)   Wt 170 lb (77.1 kg)   SpO2 99%   BMI 33.20 kg/m  General:   Alert,  pleasant and cooperative in NAD Head:  Normocephalic and atraumatic. Neck:  Supple; Lungs:  Clear throughout to auscultation.    Heart:  Regular rate and  rhythm. Abdomen:  Soft, nontender and nondistended. Normal bowel sounds, without guarding, and without rebound.   Neurologic:  Alert and  oriented x4;  grossly normal neurologically.  Impression/Plan:     SCREENING  Plan:  1. TCS TODAY. DISCUSSED PROCEDURE, BENEFITS, & RISKS: < 1% chance of medication reaction, bleeding, perforation, or rupture of spleen/liver.

## 2016-07-03 ENCOUNTER — Encounter (HOSPITAL_COMMUNITY): Payer: Self-pay | Admitting: Gastroenterology

## 2016-07-19 ENCOUNTER — Telehealth: Payer: Self-pay | Admitting: Gastroenterology

## 2016-07-19 NOTE — Telephone Encounter (Signed)
Reminder in epic °

## 2016-07-19 NOTE — Telephone Encounter (Signed)
Please call pt. She had a simple adenoma removed from her colon. FOLLOW A High fiber diet. TCS in 5-10 years.

## 2016-07-19 NOTE — Telephone Encounter (Signed)
LMOM to call.

## 2016-07-19 NOTE — Telephone Encounter (Signed)
Pt called and was informed.  

## 2016-08-14 DIAGNOSIS — C6931 Malignant neoplasm of right choroid: Secondary | ICD-10-CM | POA: Diagnosis not present

## 2016-08-14 DIAGNOSIS — Z23 Encounter for immunization: Secondary | ICD-10-CM | POA: Diagnosis not present

## 2016-11-07 DIAGNOSIS — Z4420 Encounter for fitting and adjustment of artificial eye, unspecified: Secondary | ICD-10-CM | POA: Diagnosis not present

## 2016-12-19 DIAGNOSIS — Z9001 Acquired absence of eye: Secondary | ICD-10-CM | POA: Diagnosis not present

## 2016-12-19 DIAGNOSIS — C6931 Malignant neoplasm of right choroid: Secondary | ICD-10-CM | POA: Diagnosis not present

## 2016-12-19 DIAGNOSIS — Z970 Presence of artificial eye: Secondary | ICD-10-CM | POA: Diagnosis not present

## 2017-01-09 DIAGNOSIS — H524 Presbyopia: Secondary | ICD-10-CM | POA: Diagnosis not present

## 2017-01-24 DIAGNOSIS — C6931 Malignant neoplasm of right choroid: Secondary | ICD-10-CM | POA: Diagnosis not present

## 2017-01-31 DIAGNOSIS — R931 Abnormal findings on diagnostic imaging of heart and coronary circulation: Secondary | ICD-10-CM | POA: Diagnosis not present

## 2017-01-31 DIAGNOSIS — C6931 Malignant neoplasm of right choroid: Secondary | ICD-10-CM | POA: Diagnosis not present

## 2017-04-17 DIAGNOSIS — Z970 Presence of artificial eye: Secondary | ICD-10-CM | POA: Diagnosis not present

## 2017-04-17 DIAGNOSIS — Z9001 Acquired absence of eye: Secondary | ICD-10-CM | POA: Diagnosis not present

## 2017-04-17 DIAGNOSIS — C6931 Malignant neoplasm of right choroid: Secondary | ICD-10-CM | POA: Diagnosis not present

## 2017-04-26 DIAGNOSIS — R5383 Other fatigue: Secondary | ICD-10-CM | POA: Diagnosis not present

## 2017-04-26 DIAGNOSIS — C6931 Malignant neoplasm of right choroid: Secondary | ICD-10-CM | POA: Diagnosis not present

## 2017-05-29 ENCOUNTER — Other Ambulatory Visit (HOSPITAL_COMMUNITY): Payer: Self-pay | Admitting: Family Medicine

## 2017-05-29 DIAGNOSIS — Z1231 Encounter for screening mammogram for malignant neoplasm of breast: Secondary | ICD-10-CM

## 2017-06-06 ENCOUNTER — Ambulatory Visit (HOSPITAL_COMMUNITY)
Admission: RE | Admit: 2017-06-06 | Discharge: 2017-06-06 | Disposition: A | Discharge status: Home / Self Care | Payer: Medicare HMO | Source: Ambulatory Visit | Attending: Family Medicine | Admitting: Family Medicine

## 2017-06-06 DIAGNOSIS — Z1231 Encounter for screening mammogram for malignant neoplasm of breast: Secondary | ICD-10-CM | POA: Insufficient documentation

## 2017-07-27 DIAGNOSIS — Z4420 Encounter for fitting and adjustment of artificial eye, unspecified: Secondary | ICD-10-CM | POA: Diagnosis not present

## 2017-08-08 DIAGNOSIS — C6931 Malignant neoplasm of right choroid: Secondary | ICD-10-CM | POA: Diagnosis not present

## 2017-08-08 DIAGNOSIS — Z08 Encounter for follow-up examination after completed treatment for malignant neoplasm: Secondary | ICD-10-CM | POA: Diagnosis not present

## 2017-08-08 DIAGNOSIS — Z91041 Radiographic dye allergy status: Secondary | ICD-10-CM | POA: Diagnosis not present

## 2017-08-15 DIAGNOSIS — Z9001 Acquired absence of eye: Secondary | ICD-10-CM | POA: Diagnosis not present

## 2017-08-15 DIAGNOSIS — Z970 Presence of artificial eye: Secondary | ICD-10-CM | POA: Diagnosis not present

## 2017-08-15 DIAGNOSIS — C6931 Malignant neoplasm of right choroid: Secondary | ICD-10-CM | POA: Diagnosis not present

## 2017-08-15 DIAGNOSIS — Z23 Encounter for immunization: Secondary | ICD-10-CM | POA: Diagnosis not present

## 2017-08-15 DIAGNOSIS — E785 Hyperlipidemia, unspecified: Secondary | ICD-10-CM | POA: Diagnosis not present

## 2017-10-17 DIAGNOSIS — Z08 Encounter for follow-up examination after completed treatment for malignant neoplasm: Secondary | ICD-10-CM | POA: Diagnosis not present

## 2017-10-17 DIAGNOSIS — Z91041 Radiographic dye allergy status: Secondary | ICD-10-CM | POA: Diagnosis not present

## 2017-10-17 DIAGNOSIS — C6931 Malignant neoplasm of right choroid: Secondary | ICD-10-CM | POA: Diagnosis not present

## 2018-01-25 DIAGNOSIS — Z4420 Encounter for fitting and adjustment of artificial eye, unspecified: Secondary | ICD-10-CM | POA: Diagnosis not present

## 2018-02-12 DIAGNOSIS — Z9001 Acquired absence of eye: Secondary | ICD-10-CM | POA: Diagnosis not present

## 2018-02-12 DIAGNOSIS — Z970 Presence of artificial eye: Secondary | ICD-10-CM | POA: Diagnosis not present

## 2018-02-12 DIAGNOSIS — C6931 Malignant neoplasm of right choroid: Secondary | ICD-10-CM | POA: Diagnosis not present

## 2018-02-12 DIAGNOSIS — E785 Hyperlipidemia, unspecified: Secondary | ICD-10-CM | POA: Diagnosis not present

## 2018-02-13 DIAGNOSIS — Z9001 Acquired absence of eye: Secondary | ICD-10-CM | POA: Diagnosis not present

## 2018-02-13 DIAGNOSIS — C6931 Malignant neoplasm of right choroid: Secondary | ICD-10-CM | POA: Diagnosis not present

## 2018-02-13 DIAGNOSIS — Z970 Presence of artificial eye: Secondary | ICD-10-CM | POA: Diagnosis not present

## 2018-04-17 DIAGNOSIS — C439 Malignant melanoma of skin, unspecified: Secondary | ICD-10-CM | POA: Diagnosis not present

## 2018-04-17 DIAGNOSIS — Z91041 Radiographic dye allergy status: Secondary | ICD-10-CM | POA: Diagnosis not present

## 2018-04-17 DIAGNOSIS — Z85840 Personal history of malignant neoplasm of eye: Secondary | ICD-10-CM | POA: Diagnosis not present

## 2018-04-17 DIAGNOSIS — Z9889 Other specified postprocedural states: Secondary | ICD-10-CM | POA: Diagnosis not present

## 2018-04-17 DIAGNOSIS — C6931 Malignant neoplasm of right choroid: Secondary | ICD-10-CM | POA: Diagnosis not present

## 2018-04-17 DIAGNOSIS — Z08 Encounter for follow-up examination after completed treatment for malignant neoplasm: Secondary | ICD-10-CM | POA: Diagnosis not present

## 2018-04-24 DIAGNOSIS — Z6829 Body mass index (BMI) 29.0-29.9, adult: Secondary | ICD-10-CM | POA: Diagnosis not present

## 2018-04-24 DIAGNOSIS — R253 Fasciculation: Secondary | ICD-10-CM | POA: Diagnosis not present

## 2018-04-27 DIAGNOSIS — Z6829 Body mass index (BMI) 29.0-29.9, adult: Secondary | ICD-10-CM | POA: Diagnosis not present

## 2018-04-27 DIAGNOSIS — R21 Rash and other nonspecific skin eruption: Secondary | ICD-10-CM | POA: Diagnosis not present

## 2018-04-27 DIAGNOSIS — R253 Fasciculation: Secondary | ICD-10-CM | POA: Diagnosis not present

## 2018-04-27 DIAGNOSIS — L309 Dermatitis, unspecified: Secondary | ICD-10-CM | POA: Diagnosis not present

## 2018-05-06 DIAGNOSIS — Z6829 Body mass index (BMI) 29.0-29.9, adult: Secondary | ICD-10-CM | POA: Diagnosis not present

## 2018-05-06 DIAGNOSIS — R21 Rash and other nonspecific skin eruption: Secondary | ICD-10-CM | POA: Diagnosis not present

## 2018-05-08 DIAGNOSIS — S20461A Insect bite (nonvenomous) of right back wall of thorax, initial encounter: Secondary | ICD-10-CM | POA: Diagnosis not present

## 2018-07-15 ENCOUNTER — Other Ambulatory Visit (HOSPITAL_COMMUNITY): Payer: Self-pay | Admitting: Family Medicine

## 2018-07-15 DIAGNOSIS — Z1231 Encounter for screening mammogram for malignant neoplasm of breast: Secondary | ICD-10-CM

## 2018-08-05 ENCOUNTER — Ambulatory Visit (HOSPITAL_COMMUNITY)
Admission: RE | Admit: 2018-08-05 | Discharge: 2018-08-05 | Disposition: A | Discharge status: Home / Self Care | Payer: Medicare HMO | Source: Ambulatory Visit | Attending: Family Medicine | Admitting: Family Medicine

## 2018-08-05 DIAGNOSIS — Z1231 Encounter for screening mammogram for malignant neoplasm of breast: Secondary | ICD-10-CM

## 2018-08-13 DIAGNOSIS — Z6830 Body mass index (BMI) 30.0-30.9, adult: Secondary | ICD-10-CM | POA: Diagnosis not present

## 2018-08-13 DIAGNOSIS — Z970 Presence of artificial eye: Secondary | ICD-10-CM | POA: Diagnosis not present

## 2018-08-13 DIAGNOSIS — Z9001 Acquired absence of eye: Secondary | ICD-10-CM | POA: Diagnosis not present

## 2018-08-13 DIAGNOSIS — E785 Hyperlipidemia, unspecified: Secondary | ICD-10-CM | POA: Diagnosis not present

## 2018-08-13 DIAGNOSIS — C6931 Malignant neoplasm of right choroid: Secondary | ICD-10-CM | POA: Diagnosis not present

## 2018-08-14 DIAGNOSIS — R69 Illness, unspecified: Secondary | ICD-10-CM | POA: Diagnosis not present

## 2018-08-14 DIAGNOSIS — H539 Unspecified visual disturbance: Secondary | ICD-10-CM | POA: Diagnosis not present

## 2018-08-16 DIAGNOSIS — Z4420 Encounter for fitting and adjustment of artificial eye, unspecified: Secondary | ICD-10-CM | POA: Diagnosis not present

## 2018-10-16 DIAGNOSIS — Z08 Encounter for follow-up examination after completed treatment for malignant neoplasm: Secondary | ICD-10-CM | POA: Diagnosis not present

## 2018-10-16 DIAGNOSIS — C6931 Malignant neoplasm of right choroid: Secondary | ICD-10-CM | POA: Diagnosis not present

## 2018-10-16 DIAGNOSIS — C439 Malignant melanoma of skin, unspecified: Secondary | ICD-10-CM | POA: Diagnosis not present

## 2018-10-16 DIAGNOSIS — Z85840 Personal history of malignant neoplasm of eye: Secondary | ICD-10-CM | POA: Diagnosis not present

## 2019-04-25 DIAGNOSIS — Z4420 Encounter for fitting and adjustment of artificial eye, unspecified: Secondary | ICD-10-CM | POA: Diagnosis not present

## 2019-05-07 DIAGNOSIS — C439 Malignant melanoma of skin, unspecified: Secondary | ICD-10-CM | POA: Diagnosis not present

## 2019-05-07 DIAGNOSIS — E882 Lipomatosis, not elsewhere classified: Secondary | ICD-10-CM | POA: Diagnosis not present

## 2019-05-07 DIAGNOSIS — C6931 Malignant neoplasm of right choroid: Secondary | ICD-10-CM | POA: Diagnosis not present

## 2019-05-07 DIAGNOSIS — C787 Secondary malignant neoplasm of liver and intrahepatic bile duct: Secondary | ICD-10-CM | POA: Diagnosis not present

## 2019-05-13 DIAGNOSIS — Z08 Encounter for follow-up examination after completed treatment for malignant neoplasm: Secondary | ICD-10-CM | POA: Diagnosis not present

## 2019-05-13 DIAGNOSIS — Z91041 Radiographic dye allergy status: Secondary | ICD-10-CM | POA: Diagnosis not present

## 2019-05-13 DIAGNOSIS — C6931 Malignant neoplasm of right choroid: Secondary | ICD-10-CM | POA: Diagnosis not present

## 2019-06-30 DIAGNOSIS — R69 Illness, unspecified: Secondary | ICD-10-CM | POA: Diagnosis not present

## 2019-07-01 DIAGNOSIS — Z9001 Acquired absence of eye: Secondary | ICD-10-CM | POA: Diagnosis not present

## 2019-07-01 DIAGNOSIS — C6931 Malignant neoplasm of right choroid: Secondary | ICD-10-CM | POA: Diagnosis not present

## 2019-07-01 DIAGNOSIS — Z970 Presence of artificial eye: Secondary | ICD-10-CM | POA: Diagnosis not present

## 2019-07-01 DIAGNOSIS — E785 Hyperlipidemia, unspecified: Secondary | ICD-10-CM | POA: Diagnosis not present

## 2019-08-20 DIAGNOSIS — C6931 Malignant neoplasm of right choroid: Secondary | ICD-10-CM | POA: Diagnosis not present

## 2019-08-20 DIAGNOSIS — Q111 Other anophthalmos: Secondary | ICD-10-CM | POA: Diagnosis not present

## 2019-09-29 ENCOUNTER — Other Ambulatory Visit (HOSPITAL_COMMUNITY): Payer: Self-pay | Admitting: Family Medicine

## 2019-09-29 DIAGNOSIS — Z1231 Encounter for screening mammogram for malignant neoplasm of breast: Secondary | ICD-10-CM

## 2019-10-01 ENCOUNTER — Ambulatory Visit (HOSPITAL_COMMUNITY)
Admission: RE | Admit: 2019-10-01 | Discharge: 2019-10-01 | Disposition: A | Discharge status: Home / Self Care | Payer: Medicare HMO | Source: Ambulatory Visit | Attending: Family Medicine | Admitting: Family Medicine

## 2019-10-01 ENCOUNTER — Other Ambulatory Visit: Payer: Self-pay

## 2019-10-01 DIAGNOSIS — Z1231 Encounter for screening mammogram for malignant neoplasm of breast: Secondary | ICD-10-CM | POA: Diagnosis not present

## 2019-10-08 ENCOUNTER — Other Ambulatory Visit (HOSPITAL_COMMUNITY): Payer: Self-pay | Admitting: Family Medicine

## 2019-10-14 ENCOUNTER — Other Ambulatory Visit (HOSPITAL_COMMUNITY): Payer: Self-pay | Admitting: Family Medicine

## 2019-10-14 DIAGNOSIS — R928 Other abnormal and inconclusive findings on diagnostic imaging of breast: Secondary | ICD-10-CM

## 2019-10-21 ENCOUNTER — Other Ambulatory Visit: Payer: Self-pay

## 2019-10-21 ENCOUNTER — Ambulatory Visit (HOSPITAL_COMMUNITY)
Admission: RE | Admit: 2019-10-21 | Discharge: 2019-10-21 | Disposition: A | Discharge status: Home / Self Care | Payer: Medicare HMO | Source: Ambulatory Visit | Attending: Family Medicine | Admitting: Family Medicine

## 2019-10-21 ENCOUNTER — Ambulatory Visit (HOSPITAL_COMMUNITY): Admission: RE | Admit: 2019-10-21 | Payer: Medicare HMO | Source: Ambulatory Visit

## 2019-10-21 DIAGNOSIS — R928 Other abnormal and inconclusive findings on diagnostic imaging of breast: Secondary | ICD-10-CM | POA: Insufficient documentation

## 2019-10-24 DIAGNOSIS — Z4420 Encounter for fitting and adjustment of artificial eye, unspecified: Secondary | ICD-10-CM | POA: Diagnosis not present

## 2019-11-01 DIAGNOSIS — Z23 Encounter for immunization: Secondary | ICD-10-CM | POA: Diagnosis not present

## 2019-11-29 DIAGNOSIS — Z23 Encounter for immunization: Secondary | ICD-10-CM | POA: Diagnosis not present

## 2019-12-15 DIAGNOSIS — Z91041 Radiographic dye allergy status: Secondary | ICD-10-CM | POA: Diagnosis not present

## 2019-12-15 DIAGNOSIS — R911 Solitary pulmonary nodule: Secondary | ICD-10-CM | POA: Diagnosis not present

## 2019-12-15 DIAGNOSIS — Z79899 Other long term (current) drug therapy: Secondary | ICD-10-CM | POA: Diagnosis not present

## 2019-12-15 DIAGNOSIS — Z7952 Long term (current) use of systemic steroids: Secondary | ICD-10-CM | POA: Diagnosis not present

## 2019-12-15 DIAGNOSIS — C6931 Malignant neoplasm of right choroid: Secondary | ICD-10-CM | POA: Diagnosis not present

## 2019-12-15 DIAGNOSIS — Z85840 Personal history of malignant neoplasm of eye: Secondary | ICD-10-CM | POA: Diagnosis not present

## 2019-12-15 DIAGNOSIS — C439 Malignant melanoma of skin, unspecified: Secondary | ICD-10-CM | POA: Diagnosis not present

## 2019-12-15 DIAGNOSIS — R590 Localized enlarged lymph nodes: Secondary | ICD-10-CM | POA: Diagnosis not present

## 2019-12-15 DIAGNOSIS — N3289 Other specified disorders of bladder: Secondary | ICD-10-CM | POA: Diagnosis not present

## 2019-12-15 DIAGNOSIS — Z08 Encounter for follow-up examination after completed treatment for malignant neoplasm: Secondary | ICD-10-CM | POA: Diagnosis not present

## 2019-12-29 DIAGNOSIS — E785 Hyperlipidemia, unspecified: Secondary | ICD-10-CM | POA: Diagnosis not present

## 2019-12-29 DIAGNOSIS — C6930 Malignant neoplasm of unspecified choroid: Secondary | ICD-10-CM | POA: Diagnosis not present

## 2019-12-29 DIAGNOSIS — E789 Disorder of lipoprotein metabolism, unspecified: Secondary | ICD-10-CM | POA: Diagnosis not present

## 2019-12-29 DIAGNOSIS — Z970 Presence of artificial eye: Secondary | ICD-10-CM | POA: Diagnosis not present

## 2019-12-29 DIAGNOSIS — Z6831 Body mass index (BMI) 31.0-31.9, adult: Secondary | ICD-10-CM | POA: Diagnosis not present

## 2019-12-29 DIAGNOSIS — C6931 Malignant neoplasm of right choroid: Secondary | ICD-10-CM | POA: Diagnosis not present

## 2019-12-29 DIAGNOSIS — M791 Myalgia, unspecified site: Secondary | ICD-10-CM | POA: Diagnosis not present

## 2020-02-04 DIAGNOSIS — H524 Presbyopia: Secondary | ICD-10-CM | POA: Diagnosis not present

## 2020-03-19 DIAGNOSIS — Z4420 Encounter for fitting and adjustment of artificial eye, unspecified: Secondary | ICD-10-CM | POA: Diagnosis not present

## 2020-06-21 DIAGNOSIS — C6931 Malignant neoplasm of right choroid: Secondary | ICD-10-CM | POA: Diagnosis not present

## 2020-06-21 DIAGNOSIS — Z08 Encounter for follow-up examination after completed treatment for malignant neoplasm: Secondary | ICD-10-CM | POA: Diagnosis not present

## 2020-06-21 DIAGNOSIS — Z85840 Personal history of malignant neoplasm of eye: Secondary | ICD-10-CM | POA: Diagnosis not present

## 2020-07-05 DIAGNOSIS — C6931 Malignant neoplasm of right choroid: Secondary | ICD-10-CM | POA: Diagnosis not present

## 2020-07-05 DIAGNOSIS — E785 Hyperlipidemia, unspecified: Secondary | ICD-10-CM | POA: Diagnosis not present

## 2020-07-05 DIAGNOSIS — Z970 Presence of artificial eye: Secondary | ICD-10-CM | POA: Diagnosis not present

## 2020-07-05 DIAGNOSIS — I10 Essential (primary) hypertension: Secondary | ICD-10-CM | POA: Diagnosis not present

## 2020-07-18 DIAGNOSIS — R69 Illness, unspecified: Secondary | ICD-10-CM | POA: Diagnosis not present

## 2020-08-24 DIAGNOSIS — Q111 Other anophthalmos: Secondary | ICD-10-CM | POA: Diagnosis not present

## 2020-08-24 DIAGNOSIS — C6931 Malignant neoplasm of right choroid: Secondary | ICD-10-CM | POA: Diagnosis not present

## 2020-12-21 ENCOUNTER — Other Ambulatory Visit (HOSPITAL_COMMUNITY): Payer: Self-pay | Admitting: Family Medicine

## 2020-12-21 DIAGNOSIS — Z1231 Encounter for screening mammogram for malignant neoplasm of breast: Secondary | ICD-10-CM

## 2020-12-28 DIAGNOSIS — Z08 Encounter for follow-up examination after completed treatment for malignant neoplasm: Secondary | ICD-10-CM | POA: Diagnosis not present

## 2020-12-28 DIAGNOSIS — Z85840 Personal history of malignant neoplasm of eye: Secondary | ICD-10-CM | POA: Diagnosis not present

## 2020-12-28 DIAGNOSIS — C6931 Malignant neoplasm of right choroid: Secondary | ICD-10-CM | POA: Diagnosis not present

## 2020-12-29 ENCOUNTER — Ambulatory Visit (HOSPITAL_COMMUNITY)
Admission: RE | Admit: 2020-12-29 | Discharge: 2020-12-29 | Disposition: A | Discharge status: Home / Self Care | Payer: Medicare HMO | Source: Ambulatory Visit | Attending: Family Medicine | Admitting: Family Medicine

## 2020-12-29 DIAGNOSIS — Z1231 Encounter for screening mammogram for malignant neoplasm of breast: Secondary | ICD-10-CM | POA: Diagnosis not present

## 2021-01-03 DIAGNOSIS — Z6831 Body mass index (BMI) 31.0-31.9, adult: Secondary | ICD-10-CM | POA: Diagnosis not present

## 2021-01-03 DIAGNOSIS — Z0000 Encounter for general adult medical examination without abnormal findings: Secondary | ICD-10-CM | POA: Diagnosis not present

## 2021-01-03 DIAGNOSIS — Z970 Presence of artificial eye: Secondary | ICD-10-CM | POA: Diagnosis not present

## 2021-01-03 DIAGNOSIS — C6931 Malignant neoplasm of right choroid: Secondary | ICD-10-CM | POA: Diagnosis not present

## 2021-01-03 DIAGNOSIS — Z7189 Other specified counseling: Secondary | ICD-10-CM | POA: Diagnosis not present

## 2021-01-03 DIAGNOSIS — E785 Hyperlipidemia, unspecified: Secondary | ICD-10-CM | POA: Diagnosis not present

## 2021-02-08 DIAGNOSIS — H524 Presbyopia: Secondary | ICD-10-CM | POA: Diagnosis not present

## 2021-02-08 DIAGNOSIS — H35372 Puckering of macula, left eye: Secondary | ICD-10-CM | POA: Diagnosis not present

## 2021-06-09 ENCOUNTER — Encounter: Payer: Self-pay | Admitting: *Deleted

## 2021-07-04 DIAGNOSIS — Z6831 Body mass index (BMI) 31.0-31.9, adult: Secondary | ICD-10-CM | POA: Diagnosis not present

## 2021-07-04 DIAGNOSIS — Z970 Presence of artificial eye: Secondary | ICD-10-CM | POA: Diagnosis not present

## 2021-07-04 DIAGNOSIS — E785 Hyperlipidemia, unspecified: Secondary | ICD-10-CM | POA: Diagnosis not present

## 2021-07-04 DIAGNOSIS — C6931 Malignant neoplasm of right choroid: Secondary | ICD-10-CM | POA: Diagnosis not present

## 2021-08-02 DIAGNOSIS — R590 Localized enlarged lymph nodes: Secondary | ICD-10-CM | POA: Diagnosis not present

## 2021-08-02 DIAGNOSIS — Z7952 Long term (current) use of systemic steroids: Secondary | ICD-10-CM | POA: Diagnosis not present

## 2021-08-02 DIAGNOSIS — C439 Malignant melanoma of skin, unspecified: Secondary | ICD-10-CM | POA: Diagnosis not present

## 2021-08-02 DIAGNOSIS — C6931 Malignant neoplasm of right choroid: Secondary | ICD-10-CM | POA: Diagnosis not present

## 2021-08-30 DIAGNOSIS — C6931 Malignant neoplasm of right choroid: Secondary | ICD-10-CM | POA: Diagnosis not present

## 2022-01-02 DIAGNOSIS — C6931 Malignant neoplasm of right choroid: Secondary | ICD-10-CM | POA: Diagnosis not present

## 2022-01-02 DIAGNOSIS — E785 Hyperlipidemia, unspecified: Secondary | ICD-10-CM | POA: Diagnosis not present

## 2022-01-02 DIAGNOSIS — R0683 Snoring: Secondary | ICD-10-CM | POA: Diagnosis not present

## 2022-01-02 DIAGNOSIS — Z970 Presence of artificial eye: Secondary | ICD-10-CM | POA: Diagnosis not present

## 2022-02-09 ENCOUNTER — Other Ambulatory Visit (HOSPITAL_COMMUNITY): Payer: Self-pay | Admitting: Family Medicine

## 2022-02-09 DIAGNOSIS — Z1231 Encounter for screening mammogram for malignant neoplasm of breast: Secondary | ICD-10-CM

## 2022-02-13 DIAGNOSIS — H5212 Myopia, left eye: Secondary | ICD-10-CM | POA: Diagnosis not present

## 2022-02-13 DIAGNOSIS — H35372 Puckering of macula, left eye: Secondary | ICD-10-CM | POA: Diagnosis not present

## 2022-02-15 ENCOUNTER — Ambulatory Visit (HOSPITAL_COMMUNITY)
Admission: RE | Admit: 2022-02-15 | Discharge: 2022-02-15 | Disposition: A | Discharge status: Home / Self Care | Payer: Medicare HMO | Source: Ambulatory Visit | Attending: Family Medicine | Admitting: Family Medicine

## 2022-02-15 DIAGNOSIS — Z1231 Encounter for screening mammogram for malignant neoplasm of breast: Secondary | ICD-10-CM | POA: Diagnosis not present

## 2022-05-10 DIAGNOSIS — Z7952 Long term (current) use of systemic steroids: Secondary | ICD-10-CM | POA: Diagnosis not present

## 2022-05-10 DIAGNOSIS — Z85840 Personal history of malignant neoplasm of eye: Secondary | ICD-10-CM | POA: Diagnosis not present

## 2022-05-10 DIAGNOSIS — Z08 Encounter for follow-up examination after completed treatment for malignant neoplasm: Secondary | ICD-10-CM | POA: Diagnosis not present

## 2022-05-10 DIAGNOSIS — Z91041 Radiographic dye allergy status: Secondary | ICD-10-CM | POA: Diagnosis not present

## 2022-05-10 DIAGNOSIS — C6931 Malignant neoplasm of right choroid: Secondary | ICD-10-CM | POA: Diagnosis not present

## 2022-05-10 DIAGNOSIS — Z7982 Long term (current) use of aspirin: Secondary | ICD-10-CM | POA: Diagnosis not present

## 2022-07-04 DIAGNOSIS — C6931 Malignant neoplasm of right choroid: Secondary | ICD-10-CM | POA: Diagnosis not present

## 2022-07-04 DIAGNOSIS — Z0000 Encounter for general adult medical examination without abnormal findings: Secondary | ICD-10-CM | POA: Diagnosis not present

## 2022-07-04 DIAGNOSIS — E785 Hyperlipidemia, unspecified: Secondary | ICD-10-CM | POA: Diagnosis not present

## 2022-07-04 DIAGNOSIS — Z970 Presence of artificial eye: Secondary | ICD-10-CM | POA: Diagnosis not present

## 2022-07-04 DIAGNOSIS — C6991 Malignant neoplasm of unspecified site of right eye: Secondary | ICD-10-CM | POA: Diagnosis not present

## 2022-07-04 DIAGNOSIS — Z6831 Body mass index (BMI) 31.0-31.9, adult: Secondary | ICD-10-CM | POA: Diagnosis not present

## 2022-08-17 IMAGING — MG MM DIGITAL SCREENING BILAT W/ TOMO AND CAD
6 of 10 series · 6 of 30 positions shown · non-contrast
Comparison: Previous exam(s).

CLINICAL DATA: Screening.

EXAM:
DIGITAL SCREENING BILATERAL MAMMOGRAM WITH TOMOSYNTHESIS AND CAD
TECHNIQUE: Bilateral screening digital craniocaudal and mediolateral oblique
mammograms were obtained. Bilateral screening digital breast
tomosynthesis was performed. The images were evaluated with
computer-aided detection.

[L CC synth-2D]
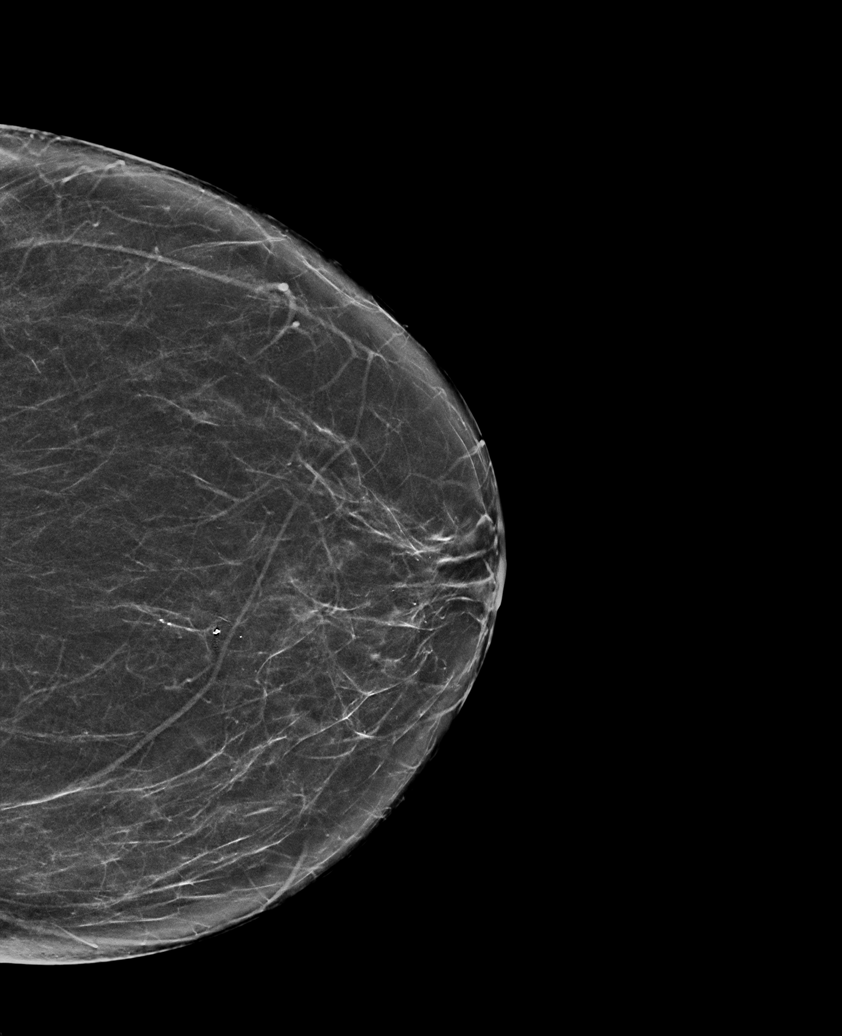

[R CC synth-2D]
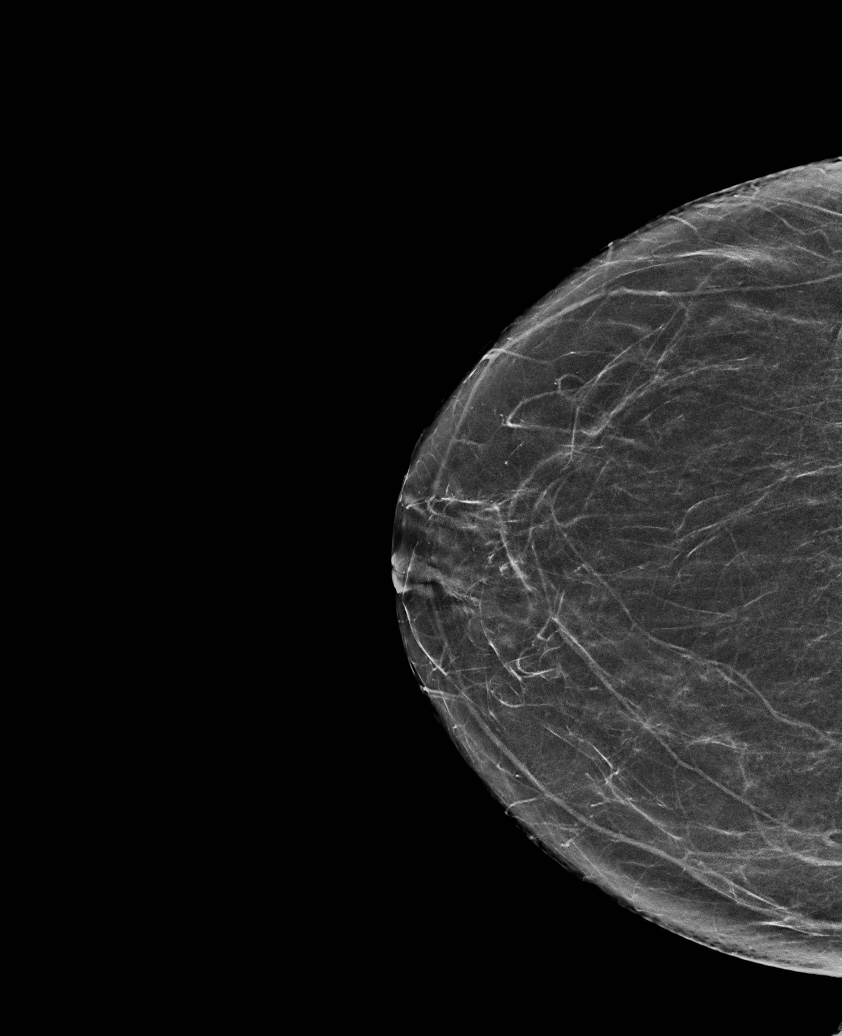

[R MLO synth-2D]
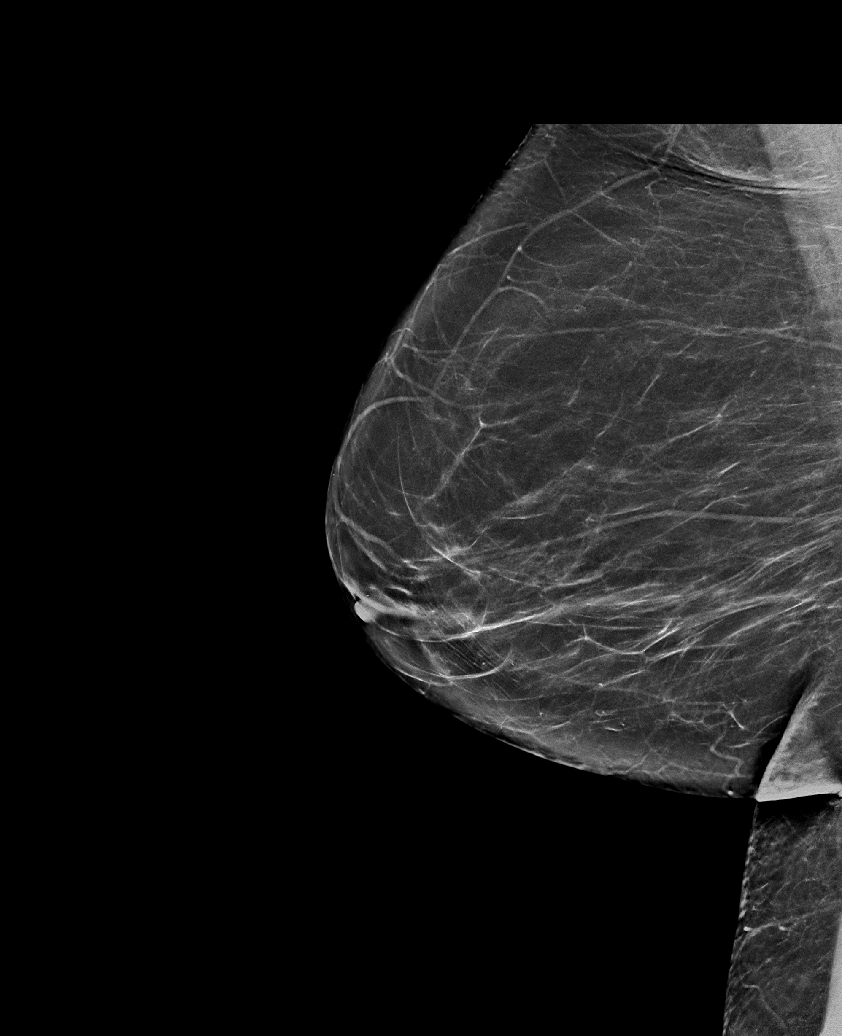

[L MLO synth-2D (1 of 2)]
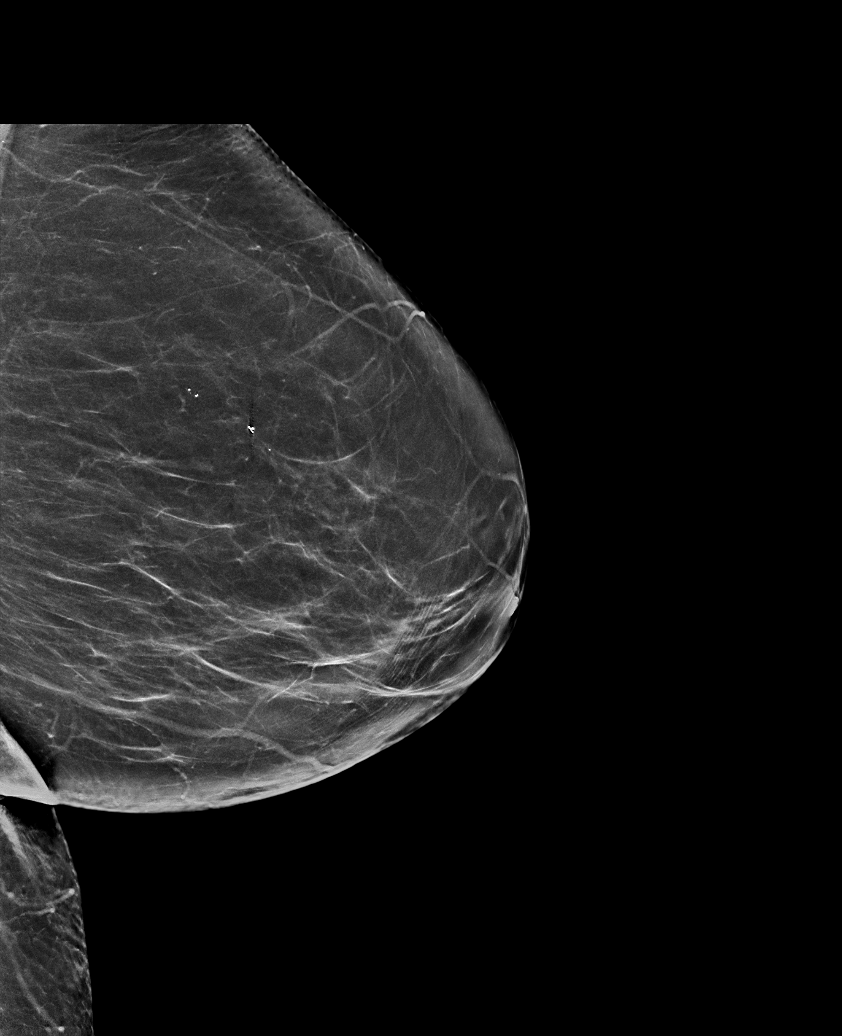

[L MLO synth-2D (2 of 2)]
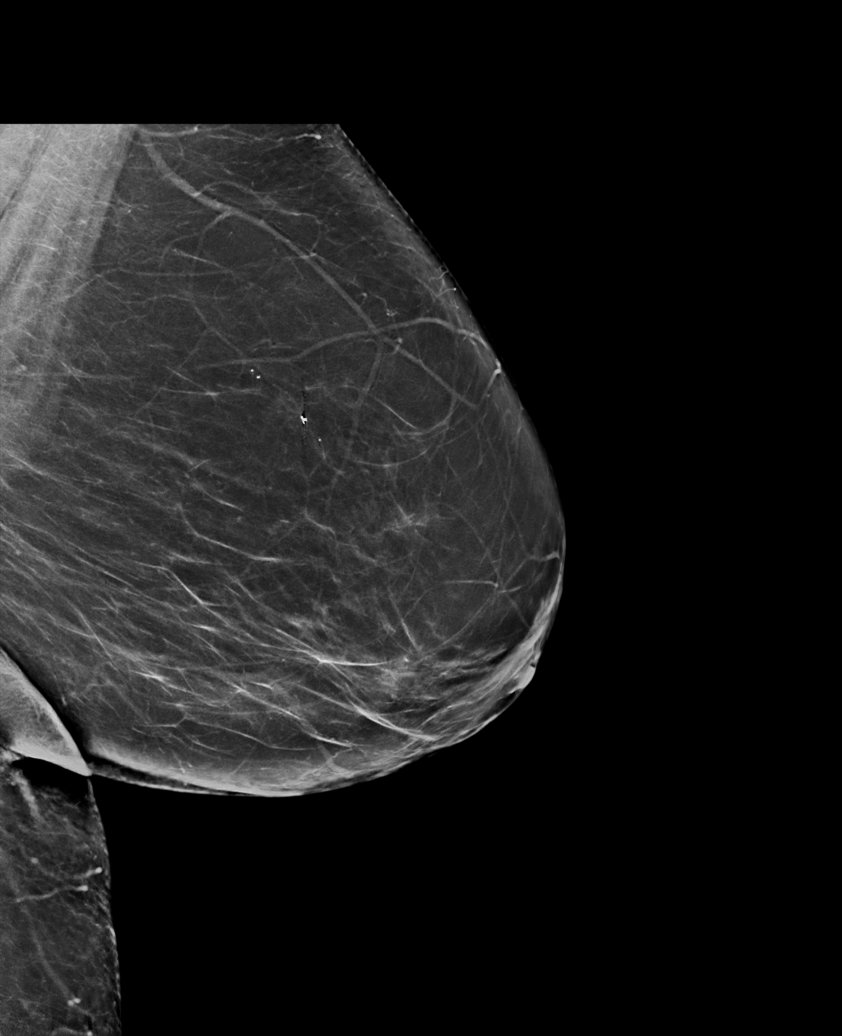

[L MLO tomo · tomo slice 39/77.0]
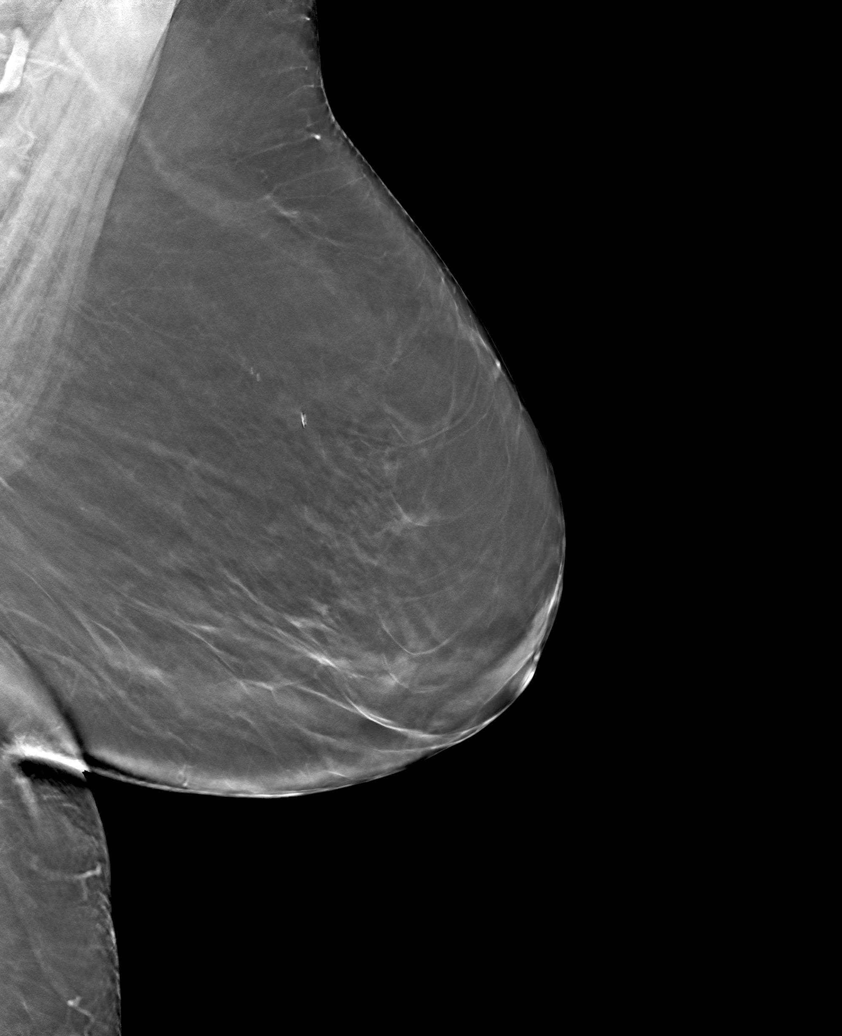

[6 of 30 positions shown; findings below may reference images not displayed]

ACR Breast Density Category b: There are scattered areas of
fibroglandular density.
FINDINGS: There are no findings suspicious for malignancy.
IMPRESSION: No mammographic evidence of malignancy. A result letter of this
screening mammogram will be mailed directly to the patient.

RECOMMENDATION:
Screening mammogram in one year. (Code:51-O-LD2)

BI-RADS CATEGORY  1: Negative.

## 2022-09-05 DIAGNOSIS — C6931 Malignant neoplasm of right choroid: Secondary | ICD-10-CM | POA: Diagnosis not present

## 2023-02-12 ENCOUNTER — Other Ambulatory Visit (HOSPITAL_COMMUNITY): Payer: Self-pay | Admitting: Family Medicine

## 2023-02-12 DIAGNOSIS — Z1231 Encounter for screening mammogram for malignant neoplasm of breast: Secondary | ICD-10-CM

## 2023-02-15 DIAGNOSIS — H35372 Puckering of macula, left eye: Secondary | ICD-10-CM | POA: Diagnosis not present

## 2023-02-15 DIAGNOSIS — H5212 Myopia, left eye: Secondary | ICD-10-CM | POA: Diagnosis not present

## 2023-02-19 ENCOUNTER — Inpatient Hospital Stay (HOSPITAL_COMMUNITY): Admission: RE | Admit: 2023-02-19 | Payer: 59 | Source: Ambulatory Visit

## 2023-02-20 DIAGNOSIS — C6931 Malignant neoplasm of right choroid: Secondary | ICD-10-CM | POA: Diagnosis not present

## 2023-02-20 DIAGNOSIS — Z08 Encounter for follow-up examination after completed treatment for malignant neoplasm: Secondary | ICD-10-CM | POA: Diagnosis not present

## 2023-02-20 DIAGNOSIS — K449 Diaphragmatic hernia without obstruction or gangrene: Secondary | ICD-10-CM | POA: Diagnosis not present

## 2023-02-20 DIAGNOSIS — Z91041 Radiographic dye allergy status: Secondary | ICD-10-CM | POA: Diagnosis not present

## 2023-02-20 DIAGNOSIS — Z79899 Other long term (current) drug therapy: Secondary | ICD-10-CM | POA: Diagnosis not present

## 2023-03-22 ENCOUNTER — Encounter (HOSPITAL_COMMUNITY): Payer: Self-pay

## 2023-03-22 ENCOUNTER — Ambulatory Visit (HOSPITAL_COMMUNITY)
Admission: RE | Admit: 2023-03-22 | Discharge: 2023-03-22 | Disposition: A | Discharge status: Home / Self Care | Payer: Medicare HMO | Source: Ambulatory Visit | Attending: Family Medicine | Admitting: Family Medicine

## 2023-03-22 DIAGNOSIS — Z1231 Encounter for screening mammogram for malignant neoplasm of breast: Secondary | ICD-10-CM | POA: Diagnosis not present

## 2023-03-26 DIAGNOSIS — E7800 Pure hypercholesterolemia, unspecified: Secondary | ICD-10-CM | POA: Diagnosis not present

## 2023-03-26 DIAGNOSIS — C6931 Malignant neoplasm of right choroid: Secondary | ICD-10-CM | POA: Diagnosis not present

## 2023-03-26 DIAGNOSIS — E789 Disorder of lipoprotein metabolism, unspecified: Secondary | ICD-10-CM | POA: Diagnosis not present

## 2023-03-26 DIAGNOSIS — Z970 Presence of artificial eye: Secondary | ICD-10-CM | POA: Diagnosis not present

## 2023-09-24 DIAGNOSIS — E782 Mixed hyperlipidemia: Secondary | ICD-10-CM | POA: Diagnosis not present

## 2023-09-24 DIAGNOSIS — Z970 Presence of artificial eye: Secondary | ICD-10-CM | POA: Diagnosis not present

## 2023-09-24 DIAGNOSIS — E7800 Pure hypercholesterolemia, unspecified: Secondary | ICD-10-CM | POA: Diagnosis not present

## 2023-09-24 DIAGNOSIS — C6931 Malignant neoplasm of right choroid: Secondary | ICD-10-CM | POA: Diagnosis not present

## 2023-11-27 DIAGNOSIS — Z91041 Radiographic dye allergy status: Secondary | ICD-10-CM | POA: Diagnosis not present

## 2023-11-27 DIAGNOSIS — I5189 Other ill-defined heart diseases: Secondary | ICD-10-CM | POA: Diagnosis not present

## 2023-11-27 DIAGNOSIS — C6931 Malignant neoplasm of right choroid: Secondary | ICD-10-CM | POA: Diagnosis not present

## 2023-11-27 DIAGNOSIS — Z08 Encounter for follow-up examination after completed treatment for malignant neoplasm: Secondary | ICD-10-CM | POA: Diagnosis not present

## 2023-12-04 DIAGNOSIS — I5189 Other ill-defined heart diseases: Secondary | ICD-10-CM | POA: Diagnosis not present

## 2023-12-04 DIAGNOSIS — Z08 Encounter for follow-up examination after completed treatment for malignant neoplasm: Secondary | ICD-10-CM | POA: Diagnosis not present

## 2023-12-04 DIAGNOSIS — C6931 Malignant neoplasm of right choroid: Secondary | ICD-10-CM | POA: Diagnosis not present

## 2023-12-04 DIAGNOSIS — Z91041 Radiographic dye allergy status: Secondary | ICD-10-CM | POA: Diagnosis not present

## 2023-12-06 DIAGNOSIS — I5189 Other ill-defined heart diseases: Secondary | ICD-10-CM | POA: Diagnosis not present

## 2023-12-06 DIAGNOSIS — C6931 Malignant neoplasm of right choroid: Secondary | ICD-10-CM | POA: Diagnosis not present

## 2023-12-25 DIAGNOSIS — I5189 Other ill-defined heart diseases: Secondary | ICD-10-CM | POA: Diagnosis not present

## 2023-12-25 DIAGNOSIS — R931 Abnormal findings on diagnostic imaging of heart and coronary circulation: Secondary | ICD-10-CM | POA: Diagnosis not present

## 2024-01-14 DIAGNOSIS — I5189 Other ill-defined heart diseases: Secondary | ICD-10-CM | POA: Diagnosis not present

## 2024-01-14 DIAGNOSIS — C6931 Malignant neoplasm of right choroid: Secondary | ICD-10-CM | POA: Diagnosis not present

## 2024-01-14 DIAGNOSIS — Z08 Encounter for follow-up examination after completed treatment for malignant neoplasm: Secondary | ICD-10-CM | POA: Diagnosis not present

## 2024-01-22 DIAGNOSIS — C439 Malignant melanoma of skin, unspecified: Secondary | ICD-10-CM | POA: Diagnosis not present

## 2024-01-22 DIAGNOSIS — R946 Abnormal results of thyroid function studies: Secondary | ICD-10-CM | POA: Diagnosis not present

## 2024-01-22 DIAGNOSIS — I5189 Other ill-defined heart diseases: Secondary | ICD-10-CM | POA: Diagnosis not present

## 2024-01-22 DIAGNOSIS — R9431 Abnormal electrocardiogram [ECG] [EKG]: Secondary | ICD-10-CM | POA: Diagnosis not present

## 2024-01-22 DIAGNOSIS — Z08 Encounter for follow-up examination after completed treatment for malignant neoplasm: Secondary | ICD-10-CM | POA: Diagnosis not present

## 2024-01-22 DIAGNOSIS — Z006 Encounter for examination for normal comparison and control in clinical research program: Secondary | ICD-10-CM | POA: Diagnosis not present

## 2024-01-22 DIAGNOSIS — C6931 Malignant neoplasm of right choroid: Secondary | ICD-10-CM | POA: Diagnosis not present

## 2024-01-30 DIAGNOSIS — C439 Malignant melanoma of skin, unspecified: Secondary | ICD-10-CM | POA: Diagnosis not present

## 2024-01-30 DIAGNOSIS — C6931 Malignant neoplasm of right choroid: Secondary | ICD-10-CM | POA: Diagnosis not present

## 2024-02-05 DIAGNOSIS — R946 Abnormal results of thyroid function studies: Secondary | ICD-10-CM | POA: Diagnosis not present

## 2024-02-18 DIAGNOSIS — H524 Presbyopia: Secondary | ICD-10-CM | POA: Diagnosis not present

## 2024-02-18 DIAGNOSIS — H35372 Puckering of macula, left eye: Secondary | ICD-10-CM | POA: Diagnosis not present

## 2024-02-20 DIAGNOSIS — I5189 Other ill-defined heart diseases: Secondary | ICD-10-CM | POA: Diagnosis not present

## 2024-02-20 DIAGNOSIS — Z5111 Encounter for antineoplastic chemotherapy: Secondary | ICD-10-CM | POA: Diagnosis not present

## 2024-02-20 DIAGNOSIS — Z5112 Encounter for antineoplastic immunotherapy: Secondary | ICD-10-CM | POA: Diagnosis not present

## 2024-02-20 DIAGNOSIS — C439 Malignant melanoma of skin, unspecified: Secondary | ICD-10-CM | POA: Diagnosis not present

## 2024-02-20 DIAGNOSIS — Z91041 Radiographic dye allergy status: Secondary | ICD-10-CM | POA: Diagnosis not present

## 2024-02-20 DIAGNOSIS — C6931 Malignant neoplasm of right choroid: Secondary | ICD-10-CM | POA: Diagnosis not present

## 2024-02-20 DIAGNOSIS — Z08 Encounter for follow-up examination after completed treatment for malignant neoplasm: Secondary | ICD-10-CM | POA: Diagnosis not present

## 2024-03-07 DIAGNOSIS — R9431 Abnormal electrocardiogram [ECG] [EKG]: Secondary | ICD-10-CM | POA: Diagnosis not present

## 2024-03-07 DIAGNOSIS — I340 Nonrheumatic mitral (valve) insufficiency: Secondary | ICD-10-CM | POA: Diagnosis not present

## 2024-03-07 DIAGNOSIS — I5189 Other ill-defined heart diseases: Secondary | ICD-10-CM | POA: Diagnosis not present

## 2024-03-12 DIAGNOSIS — C6931 Malignant neoplasm of right choroid: Secondary | ICD-10-CM | POA: Diagnosis not present

## 2024-03-12 DIAGNOSIS — C439 Malignant melanoma of skin, unspecified: Secondary | ICD-10-CM | POA: Diagnosis not present

## 2024-03-12 DIAGNOSIS — T7840XA Allergy, unspecified, initial encounter: Secondary | ICD-10-CM | POA: Diagnosis not present

## 2024-03-24 DIAGNOSIS — C6931 Malignant neoplasm of right choroid: Secondary | ICD-10-CM | POA: Diagnosis not present

## 2024-03-24 DIAGNOSIS — Z970 Presence of artificial eye: Secondary | ICD-10-CM | POA: Diagnosis not present

## 2024-03-24 DIAGNOSIS — Z6829 Body mass index (BMI) 29.0-29.9, adult: Secondary | ICD-10-CM | POA: Diagnosis not present

## 2024-03-24 DIAGNOSIS — E7800 Pure hypercholesterolemia, unspecified: Secondary | ICD-10-CM | POA: Diagnosis not present

## 2024-04-02 DIAGNOSIS — C439 Malignant melanoma of skin, unspecified: Secondary | ICD-10-CM | POA: Diagnosis not present

## 2024-04-02 DIAGNOSIS — C6931 Malignant neoplasm of right choroid: Secondary | ICD-10-CM | POA: Diagnosis not present

## 2024-04-07 ENCOUNTER — Other Ambulatory Visit (HOSPITAL_COMMUNITY): Payer: Self-pay | Admitting: Family Medicine

## 2024-04-07 DIAGNOSIS — Z1231 Encounter for screening mammogram for malignant neoplasm of breast: Secondary | ICD-10-CM

## 2024-04-11 ENCOUNTER — Ambulatory Visit (HOSPITAL_COMMUNITY)
Admission: RE | Admit: 2024-04-11 | Discharge: 2024-04-11 | Disposition: A | Discharge status: Home / Self Care | Source: Ambulatory Visit | Attending: Family Medicine | Admitting: Family Medicine

## 2024-04-11 ENCOUNTER — Encounter (HOSPITAL_COMMUNITY): Payer: Self-pay

## 2024-04-11 DIAGNOSIS — Z1231 Encounter for screening mammogram for malignant neoplasm of breast: Secondary | ICD-10-CM | POA: Insufficient documentation

## 2024-04-14 DIAGNOSIS — Z4420 Encounter for fitting and adjustment of artificial eye, unspecified: Secondary | ICD-10-CM | POA: Diagnosis not present

## 2024-04-17 DIAGNOSIS — Z5111 Encounter for antineoplastic chemotherapy: Secondary | ICD-10-CM | POA: Diagnosis not present

## 2024-04-17 DIAGNOSIS — Z91041 Radiographic dye allergy status: Secondary | ICD-10-CM | POA: Diagnosis not present

## 2024-04-17 DIAGNOSIS — K769 Liver disease, unspecified: Secondary | ICD-10-CM | POA: Diagnosis not present

## 2024-04-17 DIAGNOSIS — C6931 Malignant neoplasm of right choroid: Secondary | ICD-10-CM | POA: Diagnosis not present

## 2024-04-17 DIAGNOSIS — C439 Malignant melanoma of skin, unspecified: Secondary | ICD-10-CM | POA: Diagnosis not present

## 2024-04-17 DIAGNOSIS — Z5112 Encounter for antineoplastic immunotherapy: Secondary | ICD-10-CM | POA: Diagnosis not present

## 2024-04-17 DIAGNOSIS — I5189 Other ill-defined heart diseases: Secondary | ICD-10-CM | POA: Diagnosis not present

## 2024-04-21 DIAGNOSIS — Z5111 Encounter for antineoplastic chemotherapy: Secondary | ICD-10-CM | POA: Diagnosis not present

## 2024-04-21 DIAGNOSIS — C439 Malignant melanoma of skin, unspecified: Secondary | ICD-10-CM | POA: Diagnosis not present

## 2024-04-21 DIAGNOSIS — Z5112 Encounter for antineoplastic immunotherapy: Secondary | ICD-10-CM | POA: Diagnosis not present

## 2024-04-21 DIAGNOSIS — C6931 Malignant neoplasm of right choroid: Secondary | ICD-10-CM | POA: Diagnosis not present

## 2024-04-21 DIAGNOSIS — I340 Nonrheumatic mitral (valve) insufficiency: Secondary | ICD-10-CM | POA: Diagnosis not present

## 2024-04-21 DIAGNOSIS — I5189 Other ill-defined heart diseases: Secondary | ICD-10-CM | POA: Diagnosis not present

## 2024-04-21 DIAGNOSIS — I361 Nonrheumatic tricuspid (valve) insufficiency: Secondary | ICD-10-CM | POA: Diagnosis not present

## 2024-04-21 DIAGNOSIS — Z91041 Radiographic dye allergy status: Secondary | ICD-10-CM | POA: Diagnosis not present

## 2024-04-22 DIAGNOSIS — Z5112 Encounter for antineoplastic immunotherapy: Secondary | ICD-10-CM | POA: Diagnosis not present

## 2024-04-22 DIAGNOSIS — Z91041 Radiographic dye allergy status: Secondary | ICD-10-CM | POA: Diagnosis not present

## 2024-04-22 DIAGNOSIS — T8089XA Other complications following infusion, transfusion and therapeutic injection, initial encounter: Secondary | ICD-10-CM | POA: Diagnosis not present

## 2024-04-22 DIAGNOSIS — C380 Malignant neoplasm of heart: Secondary | ICD-10-CM | POA: Diagnosis not present

## 2024-04-22 DIAGNOSIS — L299 Pruritus, unspecified: Secondary | ICD-10-CM | POA: Diagnosis not present

## 2024-04-22 DIAGNOSIS — Z5111 Encounter for antineoplastic chemotherapy: Secondary | ICD-10-CM | POA: Diagnosis not present

## 2024-04-22 DIAGNOSIS — C6931 Malignant neoplasm of right choroid: Secondary | ICD-10-CM | POA: Diagnosis not present

## 2024-04-22 DIAGNOSIS — C787 Secondary malignant neoplasm of liver and intrahepatic bile duct: Secondary | ICD-10-CM | POA: Diagnosis not present

## 2024-05-21 DIAGNOSIS — C6931 Malignant neoplasm of right choroid: Secondary | ICD-10-CM | POA: Diagnosis not present

## 2024-05-21 DIAGNOSIS — C439 Malignant melanoma of skin, unspecified: Secondary | ICD-10-CM | POA: Diagnosis not present

## 2024-05-21 DIAGNOSIS — I5189 Other ill-defined heart diseases: Secondary | ICD-10-CM | POA: Diagnosis not present

## 2024-06-09 DIAGNOSIS — C439 Malignant melanoma of skin, unspecified: Secondary | ICD-10-CM | POA: Diagnosis not present

## 2024-06-09 DIAGNOSIS — R9431 Abnormal electrocardiogram [ECG] [EKG]: Secondary | ICD-10-CM | POA: Diagnosis not present

## 2024-06-09 DIAGNOSIS — I5189 Other ill-defined heart diseases: Secondary | ICD-10-CM | POA: Diagnosis not present

## 2024-06-18 DIAGNOSIS — C439 Malignant melanoma of skin, unspecified: Secondary | ICD-10-CM | POA: Diagnosis not present

## 2024-06-18 DIAGNOSIS — C6931 Malignant neoplasm of right choroid: Secondary | ICD-10-CM | POA: Diagnosis not present

## 2024-06-23 DIAGNOSIS — C439 Malignant melanoma of skin, unspecified: Secondary | ICD-10-CM | POA: Diagnosis not present

## 2024-06-23 DIAGNOSIS — K7689 Other specified diseases of liver: Secondary | ICD-10-CM | POA: Diagnosis not present

## 2024-06-23 DIAGNOSIS — D151 Benign neoplasm of heart: Secondary | ICD-10-CM | POA: Diagnosis not present

## 2024-06-23 DIAGNOSIS — M7989 Other specified soft tissue disorders: Secondary | ICD-10-CM | POA: Diagnosis not present

## 2024-06-30 DIAGNOSIS — C439 Malignant melanoma of skin, unspecified: Secondary | ICD-10-CM | POA: Diagnosis not present

## 2024-06-30 DIAGNOSIS — I5189 Other ill-defined heart diseases: Secondary | ICD-10-CM | POA: Diagnosis not present

## 2024-06-30 DIAGNOSIS — I340 Nonrheumatic mitral (valve) insufficiency: Secondary | ICD-10-CM | POA: Diagnosis not present

## 2024-07-03 DIAGNOSIS — C439 Malignant melanoma of skin, unspecified: Secondary | ICD-10-CM | POA: Diagnosis not present

## 2024-07-14 DIAGNOSIS — C439 Malignant melanoma of skin, unspecified: Secondary | ICD-10-CM | POA: Diagnosis not present

## 2024-07-14 DIAGNOSIS — R5383 Other fatigue: Secondary | ICD-10-CM | POA: Diagnosis not present

## 2024-07-14 DIAGNOSIS — C6931 Malignant neoplasm of right choroid: Secondary | ICD-10-CM | POA: Diagnosis not present

## 2024-07-14 DIAGNOSIS — R42 Dizziness and giddiness: Secondary | ICD-10-CM | POA: Diagnosis not present

## 2024-07-14 DIAGNOSIS — Z9289 Personal history of other medical treatment: Secondary | ICD-10-CM | POA: Diagnosis not present

## 2024-08-11 DIAGNOSIS — C439 Malignant melanoma of skin, unspecified: Secondary | ICD-10-CM | POA: Diagnosis not present

## 2024-08-11 DIAGNOSIS — C6931 Malignant neoplasm of right choroid: Secondary | ICD-10-CM | POA: Diagnosis not present

## 2024-09-08 DIAGNOSIS — Z91041 Radiographic dye allergy status: Secondary | ICD-10-CM | POA: Diagnosis not present

## 2024-09-08 DIAGNOSIS — C439 Malignant melanoma of skin, unspecified: Secondary | ICD-10-CM | POA: Diagnosis not present

## 2024-09-08 DIAGNOSIS — C6931 Malignant neoplasm of right choroid: Secondary | ICD-10-CM | POA: Diagnosis not present

## 2024-09-23 DIAGNOSIS — E7800 Pure hypercholesterolemia, unspecified: Secondary | ICD-10-CM | POA: Diagnosis not present

## 2024-09-23 DIAGNOSIS — C6931 Malignant neoplasm of right choroid: Secondary | ICD-10-CM | POA: Diagnosis not present
